# Patient Record
Sex: Male | Born: 1970 | Race: Black or African American | Hispanic: No | Marital: Married | Smoking: Never smoker
Health system: Southern US, Community
[De-identification: ages and names within clinical notes are randomized; demographics above are authoritative.]

## PROBLEM LIST (undated history)

## (undated) DIAGNOSIS — I10 Essential (primary) hypertension: Secondary | ICD-10-CM

---

## 1999-02-14 ENCOUNTER — Other Ambulatory Visit: Admission: RE | Admit: 1999-02-14 | Discharge: 1999-02-14 | Payer: Self-pay | Admitting: Urology

## 1999-07-26 ENCOUNTER — Emergency Department (HOSPITAL_COMMUNITY): Admission: EM | Admit: 1999-07-26 | Discharge: 1999-07-26 | Payer: Self-pay | Admitting: Emergency Medicine

## 2003-10-14 ENCOUNTER — Ambulatory Visit (HOSPITAL_COMMUNITY): Admission: RE | Admit: 2003-10-14 | Discharge: 2003-10-14 | Payer: Self-pay | Admitting: Family Medicine

## 2003-10-14 ENCOUNTER — Emergency Department (HOSPITAL_COMMUNITY): Admission: AD | Admit: 2003-10-14 | Discharge: 2003-10-14 | Payer: Self-pay | Admitting: Family Medicine

## 2003-10-17 ENCOUNTER — Inpatient Hospital Stay (HOSPITAL_COMMUNITY): Admission: EM | Admit: 2003-10-17 | Discharge: 2003-10-19 | Payer: Self-pay | Admitting: *Deleted

## 2003-10-18 ENCOUNTER — Encounter: Payer: Self-pay | Admitting: Cardiology

## 2007-02-11 ENCOUNTER — Emergency Department (HOSPITAL_COMMUNITY): Admission: EM | Admit: 2007-02-11 | Discharge: 2007-02-11 | Payer: Self-pay | Admitting: Emergency Medicine

## 2011-01-23 NOTE — H&P (Signed)
NAMEADVIT, TRETHEWEY NO.:  [id number]   MEDICAL RECORD NO.:  [id number]                   PATIENT TYPE:  INP   LOCATION:  2014                                 FACILITY:  [facility]   PHYSICIAN:  Vida Roller, M.D.                DATE OF BIRTH:  05-18-1971   DATE OF ADMISSION:  10/17/2003  DATE OF DISCHARGE:                                HISTORY & PHYSICAL   PRIMARY CARE PHYSICIAN:  Almedia Balls, M.D. in [place], [place].  He  has no cardiology ongoing care.   HISTORY OF PRESENT ILLNESS:  Mr. Nunn is a 40 year old African American man  who has had chest pain for about six days. He has been evaluated in the  Urgent Care section of [facility] on Friday after the  onset of the discomfort in his chest.  He was evaluated with a chest x-ray.  A CT scan of his chest looking for an aneurysm and electrocardiogram and  cardiac enzymes.  The electrocardiogram was borderline abnormal with some  inverted T-waves in the inferior leads.  He was sent home.  He states that  he was in church when it started kind of at rest.  It came on his mid  sternum under his left breast, radiated through to his back.  It was worse  with twisting motions, reproducible with palpation, primarily under the left  breast.  He has a mild amount of splinting in that area.  When he lies  still, he is quite comfortable and the pain generally resides, but when he  moves around, takes a deep breath, or does any other activity, the pain  worsens.  It does not worsen when he walks on a straight level surface or  does any other physical activity. He has no PND or orthopnea.  No lower  extremity edema.  He denies any recent upper respiratory infection. He has  not had any trauma to his lower extremities nor has he had any long car ride  or plane rides or any other stationary activity. He does work in a computer  area and does not do a lot of physical activity but that has  been a long-  term situation for him.   PAST MEDICAL HISTORY:  1. Significant for hypertension for which he takes Toprol XL; unfortunately,     he did not take his medication today.  2. 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  3. He has never had any other medical problems.   MEDICATIONS:  Toprol XL 50 mg 1 q.d.   SOCIAL HISTORY:  He lives in [place] with his wife, his 37 year old son,  and his 70-year-old daughter. He does not smoke and he never has.  He quit  drinking alcohol two years ago.  Does not use any drugs.  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   FAMILY HISTORY:  His mother is alive.  She has diabetes.  She had a bypass  surgery at relatively early age.  His father is also alive.  He had a  myocardial infarction a couple of years ago.  He has two brothers-one of  whom has hypertension.   REVIEW OF SYMPTOMS:  Relatively unremarkable, specifically all of his review  of systems is negative.   ALLERGIES:  He said that he had an anaphylactic reaction to an antibiotic  that started with V which sounds like it might have VELOSEF, I am going to  assume and anaphylaxis to CEPHALOSPORINS.   PHYSICAL EXAMINATION:  GENERAL:  He is a well-developed, well-nourished  moderately obese African American male in no apparent distress who is alert  and oriented x 4 and a very good historian.  He is afebrile.  VITAL SIGNS:  His pulse is 82 and regular in sinus.  Respiratory rate is 19.  His blood pressure is 174/113.  HEENT:  Unremarkable.  He does have a partial denture in his upper teeth.  NECK:  Supple.  He has no jugular venous distention or carotid bruits.  CARDIOVASCULAR:  He has a nondisplaced point of maximal impulse with no  lifts or thrills.  First and second heart sounds are normal.  There is a  fourth heart sound but there is no friction rub and there are no obvious  murmurs.  LUNGS:  Generally clear to auscultation,  although he is not really able to  take an entirely full breath because he does have some splinting but there  is no obvious friction rub either.  SKIN:  Without rash or lesions.  ABDOMEN:  Soft and nontender with normoactive bowel sounds.  There is no  hepatosplenomegaly noted.  GENITOURINARY:  Deferred.  EXTREMITIES:  He has no clubbing, cyanosis, or edema.  His pulses are all 2+  throughout without any bruits.  MUSCULOSKELETAL:  Within normal limits without any significant deformities.  NEUROLOGICAL:  Entirely nonfocal.   LABORATORY DATA:  His chest x-ray is pending.  His electrocardiogram shows  sinus rhythm at a rate of 71 with just mild axis deviation.  He has T-wave  inversions in the inferior leads and the anterolateral leads.  The  anterolateral T-waves are new from previous.  The inferior T-waves were  present on the electrocardiogram done on the 6th when he was evaluated.   White blood cell count of 3.3.  H&H of 16 and 48 with a platelet count of  329,000.  His d-dimer is 0.47 which is the absolute upper limits of normal.  His PTT is 28.  His PT is 12.3 with an INR of 0.9.  His electrolytes are all  pending at this time.  His one set of bedside cardiac enzymes which are  entirely normal show a myoglobin of 27.6, MB fraction of less than 1.0, and  a troponin I of less than 0.05.   ASSESSMENT:  This is a man who has chest discomfort which is very atypical  for coronary disease.  It is pleuritic and primarily positional,  reproducible with palpation.  He does have an abnormal electrocardiogram  which lends me to think that there is a possibility that this may be a  pericarditis, although he does not have an antecedent viral infection.  Other possibilities include the potential for pulmonary embolus.  He does  have uncontrolled hypertension, probably due to beta blocker rebound from his noncompliance with  his beta blocker and the d-dimer is borderline  abnormal.   PLAN:  My  plan is to do a chest CT to rule out pulmonary embolus.  Also  think an echocardiogram to make sure he does not have a significant  pericardial effusion.  The CT scan that was previously done on the 6th of  January shows that his heart might be slightly enlarged.  There was no  comment about a pericardial effusion, however.  I will cycle his cardiac  enzymes.  At this point, I am not going to aggressively treat him with an  antianginal regimen or anticoagulate him due to my thoughts regarding this  as I am uncertain as to whether or not he has pericarditis and  anticoagulation for pericarditis may precipitate a hemorrhagic pericarditis.  If a CT scan of his chest does show that he has a pulmonary embolus, then I  will anticoagulate him and obviously if he has abnormal enzymes manifest  abnormal cardiac enzymes will anticoagulate and will aggressively treat him  for that.  I think in the interim, at this time, it might not be  unreasonable to use nonsteroidal anti-inflammatory drugs for pain.  I am  going to add back his Toprol XL.  I am also going to add some Norvasc to try  to get a little bit of blood pressure control and will follow him along.                                                Vida Roller, M.D.    JH/MEDQ  D:  10/17/2003  T:  10/17/2003  Job:  [id number]

## 2011-01-23 NOTE — Discharge Summary (Signed)
Travis Malone, Travis Malone NO.:  [id number]   MEDICAL RECORD NO.:  [id number]                   PATIENT TYPE:  INP   LOCATION:  2014                                 FACILITY:  [facility]   PHYSICIAN:  Tereso Newcomer, P.A.                  DATE OF BIRTH:  [birth year]   DATE OF ADMISSION:  10/17/2003  DATE OF DISCHARGE:                                 DISCHARGE SUMMARY   DISCHARGE DIAGNOSES:  1. Pleuritic chest pain.  2. Hypertension.  3. Family history of coronary artery disease.   PROCEDURES PERFORMED:  1. Chest CT scan to rule out pulmonary embolism.  2. 2D echocardiogram.   HOSPITAL COURSE:  Please see the dictated admission history and physical by  Dr. Vida Roller for complete details. Briefly this 40 year old African  American male presented to [facility] for further evaluation of  chest discomfort. He had recently been evaluated at the urgent care section  of [facility] with the same symptoms. Apparently a CT scan was done looking  for aneurysm. The patient's chest discomfort was generally worse with  position changes. He also has worsening chest discomfort  when he takes a  deep breath. His EKG was abnormal, revealing inferolateral T-wave  inversions.   HOSPITAL COURSE:  He was admitted. Serial enzymes were checked. These were  negative for myocardial infarction. A D-dimer was performed and this was  normal at 0.47. A repeat chest CT scan was done on October 17, 2003. This  revealed minor patchy lower lobe atelectatic and infiltrative changes. There  was no CT scan evidence of pulmonary emboli. The lower extremity scan was  made for DVT.   The patient was also set up for a 2D echocardiogram. The 2D echocardiogram  revealed normal LVF with the range being 55% to 65%. The study was not  adequate for the evaluation of the left ventricular wall motion  abnormalities. There was mildly increased  left ventricular wall thickness.   The patient  was placed on nonsteroidal antiinflammatory drugs. His blood  pressure was somewhat uncontrolled on admission and Norvasc  and  chlorthalidone were both added to his Toprol.   On the morning of October 19, 2003, he was stable with much improvement in  his  chest diseased. Dr. Gerri Spore saw  the patient and felt he could be  discharged to home. Dr. Gerri Spore felt the patient should be further  evaluated for an outpatient Cardiolite, and this will be performed in our  office next week. If the Cardiolite is normal. Then he will require no  further cardiology follow up. He should follow up with his primary care  physician though in the next  1 to  2 weeks. He should complete about 5 days  of nonsteroidal antiinflammatory drugs therapy. A BMET will be checked at  the time of his Cardiolite to ensure that his potassium level remains  normal  after starting chlorthalidone.   LABORATORY DATA:  White count 3300, hemoglobin 16.3, hematocrit 47.9,  platelet count 349,000. Sedimentation rate 4. INR 0.9. Sodium 138, potassium  4.2, chloride 107, CO2 24, glucose 83, BUN 7, creatinine 1.1, calcium  9.2.  Total protein 7.6, albumin 3.8. AST 26, ALT 20, alkaline phosphatase 72,  total bilirubin 0.5. Cardiac enzymes as noted above. TSH normal at 1.13.   Chest x-ray on admission, cardiomegaly  with mildly accentuated  bronchovascular markings. A CT scan and 2D echocardiogram as noted above.   DISCHARGE MEDICATIONS:  1. Toprol XL 50 mg daily.  2. Norvasc 5 mg daily.  3. Chlorthalidone 12.5 mg daily.  4. Naprosyn 375 mg b.i.d. x5 days.  5. Tylenol  p.r.n. pain management.   DISCHARGE INSTRUCTIONS:  Activity slowly advance as tolerated. Diet low fat,  low sodium. The patient has been asked to eat potassium containing foods  such as bananas 2 to 3 times per week to help with potassium  repletion with  the initiation of chlorthalidone.   FOLLOW UP:  The patient will be set up for a stress Cardiolite in  our office  on October 24, 2003, at 12 p.m. He has been asked to remain n.p.o. after  midnight on October 23, 2003. He has been asked to refrain from  drinking  coffee or any caffeine on the day of the test. He is to also hold his Toprol  on the morning of his test. He should follow up with Dr. Almedia Balls in the  next 1 to 2 weeks. Of note a BMET will be drawn at the time of his stress  Cardiolite. If his Cardiolite is not ischemic then he will require no  further cardiac follow up.                                                Tereso Newcomer, P.A.    SW/MEDQ  D:  10/19/2003  T:  10/19/2003  Job:  [id number]   cc:   Almedia Balls  [address]  [place]  [place] [address]  Fax: [phone]

## 2011-06-25 LAB — DIFFERENTIAL
Basophils Relative: 1
Monocytes Absolute: 0.8 — ABNORMAL HIGH
Monocytes Relative: 14 — ABNORMAL HIGH
Neutro Abs: 3.5

## 2011-06-25 LAB — CBC
Hemoglobin: 15.1
MCHC: 33.5
MCV: 87.8
RBC: 5.12

## 2011-06-25 LAB — I-STAT 8, (EC8 V) (CONVERTED LAB)
BUN: 7
Bicarbonate: 25.4 — ABNORMAL HIGH
Glucose, Bld: 97
Hemoglobin: 16
Operator id: 279831
pCO2, Ven: 44.4 — ABNORMAL LOW
pH, Ven: 7.366 — ABNORMAL HIGH

## 2011-06-25 LAB — POCT CARDIAC MARKERS
CKMB, poc: <1 — ABNORMAL LOW
CKMB, poc: <1 — ABNORMAL LOW
Troponin i, poc: <0.05
Troponin i, poc: <0.05

## 2011-06-25 LAB — APTT: aPTT: 28

## 2016-07-07 ENCOUNTER — Other Ambulatory Visit (HOSPITAL_COMMUNITY): Payer: Self-pay | Admitting: Surgery

## 2016-07-21 ENCOUNTER — Ambulatory Visit (HOSPITAL_COMMUNITY): Payer: Self-pay

## 2017-04-14 ENCOUNTER — Encounter (HOSPITAL_BASED_OUTPATIENT_CLINIC_OR_DEPARTMENT_OTHER): Payer: Self-pay | Admitting: Emergency Medicine

## 2017-04-14 ENCOUNTER — Emergency Department (HOSPITAL_BASED_OUTPATIENT_CLINIC_OR_DEPARTMENT_OTHER)
Admission: EM | Admit: 2017-04-14 | Discharge: 2017-04-14 | Disposition: A | Discharge status: Home / Self Care | Payer: BLUE CROSS/BLUE SHIELD | Attending: Emergency Medicine | Admitting: Emergency Medicine

## 2017-04-14 DIAGNOSIS — I10 Essential (primary) hypertension: Secondary | ICD-10-CM | POA: Insufficient documentation

## 2017-04-14 DIAGNOSIS — Z0000 Encounter for general adult medical examination without abnormal findings: Secondary | ICD-10-CM | POA: Diagnosis not present

## 2017-04-14 DIAGNOSIS — W57XXXA Bitten or stung by nonvenomous insect and other nonvenomous arthropods, initial encounter: Secondary | ICD-10-CM | POA: Diagnosis not present

## 2017-04-14 DIAGNOSIS — Z79899 Other long term (current) drug therapy: Secondary | ICD-10-CM | POA: Insufficient documentation

## 2017-04-14 HISTORY — DX: Essential (primary) hypertension: I10

## 2017-04-14 MED ORDER — DOXYCYCLINE HYCLATE 100 MG PO TABS
200.0000 mg | ORAL_TABLET | Freq: Once | ORAL | Status: AC
  Administered 2017-04-14: 200 mg via ORAL
  Filled 2017-04-14: qty 2

## 2017-04-14 NOTE — ED Provider Notes (Signed)
[facility] Provider Note   CSN: [id number] Arrival date & time: 04/14/17  2228  By signing my name below, I, [name], attest that this documentation has been prepared under the direction and in the presence of Chayla Shands, MD. Electronically Signed: [name], ED Scribe. 04/14/17. 11:01 PM.  History   Chief Complaint Chief Complaint  Patient presents with  . Tick Removal   The history is provided by the patient. No language interpreter was used.  Illness  This is a new problem. The current episode started 12 to 24 hours ago. The problem occurs constantly. The problem has not changed since onset.Pertinent negatives include no chest pain and no abdominal pain. Nothing aggravates the symptoms. Nothing relieves the symptoms. He has tried nothing for the symptoms. The treatment provided no relief.    HPI Comments: Travis Malone Car is a 46 y.o. male who presents to the Emergency Department complaining of a resolved tick bite that he noticed on his mid-back yesterday. He denies fever.    Past Medical History:  Diagnosis Date  . Hypertension     There are no active problems to display for this patient.   History reviewed. No pertinent surgical history.     Home Medications    Prior to Admission medications   Medication Sig Start Date End Date Taking? Authorizing Provider  amLODipine (NORVASC) 5 MG tablet Take 5 mg by mouth daily.   Yes [provider]  metoprolol succinate (TOPROL-XL) 50 MG 24 hr tablet Take 50 mg by mouth daily. Take with or immediately following a meal.   Yes [provider]    Family History No family history on file.  Social History Social History  Substance Use Topics  . Smoking status: Never Smoker  . Smokeless tobacco: Never Used  . Alcohol use No     Allergies   Patient has no known allergies.   Review of Systems Review of Systems  Constitutional: Negative for fever.  Cardiovascular:  Negative for chest pain.  Gastrointestinal: Negative for abdominal pain.  Skin: Negative for rash.  All other systems reviewed and are negative.    Physical Exam Updated Vital Signs BP (!) 155/107   Pulse 77   Temp 98.4 F (36.9 C) (Oral)   Resp 18   Ht 6' (1.829 m)   Wt 272 lb (123.4 kg)   SpO2 100%   BMI 36.89 kg/m   Physical Exam  Constitutional: He is oriented to person, place, and time. He appears well-developed and well-nourished.  HENT:  Head: Normocephalic and atraumatic.  Eyes: Pupils are equal, round, and reactive to light. EOM are normal.  Neck: Normal range of motion.  Cardiovascular: Normal rate, regular rhythm, normal heart sounds and intact distal pulses.   Pulmonary/Chest: Effort normal and breath sounds normal. No respiratory distress.  Abdominal: Soft. He exhibits no distension. There is no tenderness.  Musculoskeletal: Normal range of motion.  Neurological: He is alert and oriented to person, place, and time. He displays normal reflexes.  Skin: Skin is warm and dry. No rash noted.  No target lesions  Psychiatric: He has a normal mood and affect. Judgment normal.  Nursing note and vitals reviewed.    ED Treatments / Results   DIAGNOSTIC STUDIES: Oxygen Saturation is 100% on RA, normal by my interpretation.   COORDINATION OF CARE: 11:03 PM-Discussed next steps with pt. Pt verbalized understanding and is agreeable with the plan.    Procedures Procedures (including critical care time)  Medications Ordered in ED  Medications  doxycycline (VIBRA-TABS) tablet 200 mg (200 mg Oral Given 04/14/17 2307)     Tick was on the skin and not buried.    200 of doxycyline as the tick was not embedded, per CDC guidelines.   Final Clinical Impressions(s) / ED Diagnoses   She is very well appearing and has been observed in the ED.  Strict return precautions given for facial swelling, fevers, drooling, swelling of the mouth or throat, vomiting, weakness,  inability to tolerate oral liquids or foods, shortness of breath, changes in vision or thinking, weakness or numbness or any concerns. No signs of systemic illness or infection. The patient is nontoxic-appearing on exam and vital signs are within normal limits.     I have reviewed the triage vital signs and the nursing notes. Pertinent labs &imaging results that were available during my care of the patient were reviewed by me and considered in my medical decision making (see chart for details).  After history, exam, and medical workup I feel the patient has been appropriately medically screened and is safe for discharge home. Pertinent diagnoses were discussed with the patient. Patient was given return precautions.  I personally performed the services described in this documentation, which was scribed in my presence. The recorded information has been reviewed and is accurate.      Cylus Douville, MD 04/15/17 [phone]

## 2017-04-14 NOTE — ED Notes (Signed)
ED Provider at bedside. 

## 2017-04-14 NOTE — ED Notes (Signed)
Pt has tick on back. Rubbed tick with alcohol pad and tick came off whole.

## 2017-04-15 ENCOUNTER — Encounter (HOSPITAL_BASED_OUTPATIENT_CLINIC_OR_DEPARTMENT_OTHER): Payer: Self-pay | Admitting: Emergency Medicine

## 2019-09-11 ENCOUNTER — Encounter (HOSPITAL_BASED_OUTPATIENT_CLINIC_OR_DEPARTMENT_OTHER): Payer: Self-pay | Admitting: Emergency Medicine

## 2019-09-11 ENCOUNTER — Inpatient Hospital Stay (HOSPITAL_BASED_OUTPATIENT_CLINIC_OR_DEPARTMENT_OTHER)
Admission: EM | Admit: 2019-09-11 | Discharge: 2019-09-13 | DRG: 177 | Disposition: A | Discharge status: Home / Self Care | Payer: BC Managed Care – PPO | Attending: Internal Medicine | Admitting: Internal Medicine

## 2019-09-11 ENCOUNTER — Other Ambulatory Visit: Payer: Self-pay

## 2019-09-11 ENCOUNTER — Emergency Department (HOSPITAL_BASED_OUTPATIENT_CLINIC_OR_DEPARTMENT_OTHER): Payer: BC Managed Care – PPO

## 2019-09-11 DIAGNOSIS — Z6836 Body mass index (BMI) 36.0-36.9, adult: Secondary | ICD-10-CM

## 2019-09-11 DIAGNOSIS — J1282 Pneumonia due to coronavirus disease 2019: Secondary | ICD-10-CM | POA: Diagnosis present

## 2019-09-11 DIAGNOSIS — J9601 Acute respiratory failure with hypoxia: Secondary | ICD-10-CM | POA: Diagnosis present

## 2019-09-11 DIAGNOSIS — R110 Nausea: Secondary | ICD-10-CM | POA: Diagnosis present

## 2019-09-11 DIAGNOSIS — E669 Obesity, unspecified: Secondary | ICD-10-CM | POA: Diagnosis present

## 2019-09-11 DIAGNOSIS — J069 Acute upper respiratory infection, unspecified: Secondary | ICD-10-CM | POA: Diagnosis not present

## 2019-09-11 DIAGNOSIS — R059 Cough, unspecified: Secondary | ICD-10-CM

## 2019-09-11 DIAGNOSIS — R05 Cough: Secondary | ICD-10-CM | POA: Diagnosis present

## 2019-09-11 DIAGNOSIS — Z79899 Other long term (current) drug therapy: Secondary | ICD-10-CM | POA: Diagnosis not present

## 2019-09-11 DIAGNOSIS — R0902 Hypoxemia: Secondary | ICD-10-CM

## 2019-09-11 DIAGNOSIS — I10 Essential (primary) hypertension: Secondary | ICD-10-CM | POA: Diagnosis present

## 2019-09-11 DIAGNOSIS — R7989 Other specified abnormal findings of blood chemistry: Secondary | ICD-10-CM | POA: Diagnosis not present

## 2019-09-11 DIAGNOSIS — J189 Pneumonia, unspecified organism: Secondary | ICD-10-CM | POA: Diagnosis present

## 2019-09-11 DIAGNOSIS — U071 COVID-19: Secondary | ICD-10-CM | POA: Diagnosis present

## 2019-09-11 LAB — CBC WITH DIFFERENTIAL/PLATELET
Abs Immature Granulocytes: 0.02 10*3/uL (ref 0.00–0.07)
Basophils Absolute: 0 10*3/uL (ref 0.0–0.1)
Basophils Relative: 1 %
Eosinophils Absolute: 0.1 10*3/uL (ref 0.0–0.5)
Eosinophils Relative: 1 %
HCT: 41.3 % (ref 39.0–52.0)
Hemoglobin: 14.7 g/dL (ref 13.0–17.0)
Immature Granulocytes: 0 %
Lymphocytes Relative: 25 %
Lymphs Abs: 1.4 10*3/uL (ref 0.7–4.0)
MCH: 30.8 pg (ref 26.0–34.0)
MCHC: 35.6 g/dL (ref 30.0–36.0)
MCV: 86.4 fL (ref 80.0–100.0)
Monocytes Absolute: 0.7 10*3/uL (ref 0.1–1.0)
Monocytes Relative: 12 %
Neutro Abs: 3.4 10*3/uL (ref 1.7–7.7)
Neutrophils Relative %: 61 %
Platelets: 275 10*3/uL (ref 150–400)
RBC: 4.78 MIL/uL (ref 4.22–5.81)
RDW: 13.4 % (ref 11.5–15.5)
WBC: 5.5 10*3/uL (ref 4.0–10.5)
nRBC: 0 % (ref 0.0–0.2)

## 2019-09-11 LAB — LACTIC ACID, PLASMA: Lactic Acid, Venous: 0.7 mmol/L (ref 0.5–1.9)

## 2019-09-11 LAB — COMPREHENSIVE METABOLIC PANEL
ALT: 26 U/L (ref 0–44)
AST: 34 U/L (ref 15–41)
Albumin: 3.3 g/dL — ABNORMAL LOW (ref 3.5–5.0)
Alkaline Phosphatase: 48 U/L (ref 38–126)
Anion gap: 10 (ref 5–15)
BUN: 12 mg/dL (ref 6–20)
CO2: 26 mmol/L (ref 22–32)
Calcium: 8.4 mg/dL — ABNORMAL LOW (ref 8.9–10.3)
Chloride: 100 mmol/L (ref 98–111)
Creatinine, Ser: 1.26 mg/dL — ABNORMAL HIGH (ref 0.61–1.24)
GFR calc Af Amer: >60 mL/min (ref >60)
GFR calc non Af Amer: >60 mL/min (ref >60)
Glucose, Bld: 91 mg/dL (ref 70–99)
Potassium: 3.7 mmol/L (ref 3.5–5.1)
Sodium: 136 mmol/L (ref 135–145)
Total Bilirubin: 0.8 mg/dL (ref 0.3–1.2)
Total Protein: 7.5 g/dL (ref 6.5–8.1)

## 2019-09-11 LAB — FERRITIN: Ferritin: 233 ng/mL (ref 24–336)

## 2019-09-11 LAB — LACTATE DEHYDROGENASE: LDH: 380 U/L — ABNORMAL HIGH (ref 98–192)

## 2019-09-11 LAB — D-DIMER, QUANTITATIVE: D-Dimer, Quant: 5.05 ug/mL-FEU — ABNORMAL HIGH (ref 0.00–0.50)

## 2019-09-11 LAB — PROCALCITONIN: Procalcitonin: 0.12 ng/mL

## 2019-09-11 LAB — TRIGLYCERIDES: Triglycerides: 150 mg/dL — ABNORMAL HIGH (ref <150)

## 2019-09-11 LAB — FIBRINOGEN: Fibrinogen: 643 mg/dL — ABNORMAL HIGH (ref 210–475)

## 2019-09-11 LAB — C-REACTIVE PROTEIN: CRP: 14.2 mg/dL — ABNORMAL HIGH (ref <1.0)

## 2019-09-11 MED ORDER — ALBUTEROL SULFATE (2.5 MG/3ML) 0.083% IN NEBU: 5.0000 mg | INHALATION_SOLUTION | Freq: Once | RESPIRATORY_TRACT | Status: DC

## 2019-09-11 MED ORDER — SODIUM CHLORIDE 0.9 % IV SOLN
100.0000 mg | Freq: Every day | INTRAVENOUS | Status: DC
  Administered 2019-09-12 – 2019-09-13 (×2): 100 mg via INTRAVENOUS
  Filled 2019-09-11 (×3): qty 20

## 2019-09-11 MED ORDER — SODIUM CHLORIDE 0.9 % IV SOLN
100.0000 mg | INTRAVENOUS | Status: AC
  Administered 2019-09-11 (×2): 100 mg via INTRAVENOUS
  Filled 2019-09-11 (×3): qty 20

## 2019-09-11 MED ORDER — ALBUTEROL SULFATE HFA 108 (90 BASE) MCG/ACT IN AERS
INHALATION_SPRAY | RESPIRATORY_TRACT | Status: AC
  Administered 2019-09-11: 8
  Filled 2019-09-11: qty 6.7

## 2019-09-11 MED ORDER — DEXAMETHASONE SODIUM PHOSPHATE 10 MG/ML IJ SOLN
6.0000 mg | Freq: Once | INTRAMUSCULAR | Status: AC
  Administered 2019-09-11: 6 mg via INTRAVENOUS
  Filled 2019-09-11: qty 1

## 2019-09-11 NOTE — ED Triage Notes (Signed)
Pt states that he has had COVID x 1.5 weeks - the patient has started to have Cough Congestion and more generalized aches - the pt also reports that he has a fever up to 102.0

## 2019-09-11 NOTE — ED Provider Notes (Signed)
[facility] EMERGENCY DEPARTMENT Provider Note   CSN: [id number] Arrival date & time: 09/11/19  7414     History Chief Complaint  Patient presents with  . Generalized Body Aches    COVID+    Travis Malone is a 49 y.o. male.  HPI   Patient presents to the emergency room with complaints of shortness of breath and fever.  Patient states he was diagnosed with Covid a couple days before Christmas.  His symptoms have persisted and have progressed.  He is having myalgias.  He continues to have fevers.  He has been coughing and feels a rattling in his chest.  He is feeling lightheaded and short of breath now especially with any minimal activity.  Patient was seen in urgent care on the 29th because of his persistent symptoms.  He was prescribed cough medications.  He has continued to progress despite those treatments.  Past Medical History:  Diagnosis Date  . Hypertension     There are no problems to display for this patient.   History reviewed. No pertinent surgical history.     History reviewed. No pertinent family history.  Social History   Tobacco Use  . Smoking status: Never Smoker  . Smokeless tobacco: Never Used  Substance Use Topics  . Alcohol use: No  . Drug use: Not on file    Home Medications Prior to Admission medications   Medication Sig Start Date End Date Taking? Authorizing Provider  amLODipine (NORVASC) 5 MG tablet Take 5 mg by mouth daily.    [provider]  metoprolol succinate (TOPROL-XL) 50 MG 24 hr tablet Take 50 mg by mouth daily. Take with or immediately following a meal.    [provider]    Allergies    Patient has no known allergies.  Review of Systems   Review of Systems  All other systems reviewed and are negative.   Physical Exam Updated Vital Signs BP (!) 130/92 (BP Location: Left Arm)   Pulse 74   Temp 98.3 F (36.8 C) (Oral)   Resp 16   Ht 1.829 m (6')   Wt 122.9 kg   SpO2 93%   BMI 36.75  kg/m   Physical Exam Vitals and nursing note reviewed.  Constitutional:      Appearance: He is well-developed. He is ill-appearing. He is not toxic-appearing or diaphoretic.  HENT:     Head: Normocephalic and atraumatic.     Right Ear: External ear normal.     Left Ear: External ear normal.  Eyes:     General: No scleral icterus.       Right eye: No discharge.        Left eye: No discharge.     Conjunctiva/sclera: Conjunctivae normal.  Neck:     Trachea: No tracheal deviation.  Cardiovascular:     Rate and Rhythm: Normal rate and regular rhythm.  Pulmonary:     Effort: Pulmonary effort is normal. No respiratory distress.     Breath sounds: Normal breath sounds. No stridor. No wheezing or rales.     Comments: Oxygen saturation noted to be 88 at the bedside before being placed on oxygen Abdominal:     General: Bowel sounds are normal. There is no distension.     Palpations: Abdomen is soft.     Tenderness: There is no abdominal tenderness. There is no guarding or rebound.  Musculoskeletal:        General: No tenderness.  Cervical back: Neck supple.  Skin:    General: Skin is warm and dry.     Findings: No rash.  Neurological:     Mental Status: He is alert.     Cranial Nerves: No cranial nerve deficit (no facial droop, extraocular movements intact, no slurred speech).     Sensory: No sensory deficit.     Motor: No abnormal muscle tone or seizure activity.     Coordination: Coordination normal.     ED Results / Procedures / Treatments   Labs (all labs ordered are listed, but only abnormal results are displayed) Labs Reviewed  COMPREHENSIVE METABOLIC PANEL - Abnormal; Notable for the following components:      Result Value   Creatinine, Ser 1.26 (*)    Calcium 8.4 (*)    Albumin 3.3 (*)    All other components within normal limits  D-DIMER, QUANTITATIVE (NOT AT [facility]) - Abnormal; Notable for the following components:   D-Dimer, Quant 5.05 (*)    All other  components within normal limits  CULTURE, BLOOD (ROUTINE X 2)  CULTURE, BLOOD (ROUTINE X 2)  LACTIC ACID, PLASMA  CBC WITH DIFFERENTIAL/PLATELET  LACTIC ACID, PLASMA  PROCALCITONIN  LACTATE DEHYDROGENASE  FERRITIN  TRIGLYCERIDES  FIBRINOGEN  C-REACTIVE PROTEIN    EKG EKG Interpretation  Date/Time:  Monday September 11 2019 12:22:15 EST Ventricular Rate:  71 PR Interval:    QRS Duration: 89 QT Interval:  401 QTC Calculation: 436 R Axis:   57 Text Interpretation: Sinus rhythm Abnormal T, consider ischemia, diffuse leads No significant change since last tracing Confirmed by Dorie Rank [phone]) on 09/11/2019 12:47:23 PM   Radiology DG Chest Port 1 View  Result Date: 09/11/2019 CLINICAL DATA:  Cough and congestion.  Reported COVID-19 positive EXAM: PORTABLE CHEST 1 VIEW COMPARISON:  February 11, 2007. FINDINGS: There is patchy airspace opacity in each mid and lower lung zone. Heart is borderline enlarged with pulmonary vascularity normal. No adenopathy. No bone lesions. IMPRESSION: Patchy airspace opacity bilaterally consistent with multifocal pneumonia. Borderline cardiac enlargement. No evident adenopathy. Electronically Signed   By: Lowella Grip III M.D.   On: 09/11/2019 11:04    Procedures Procedures (including critical care time)  Medications Ordered in ED Medications  albuterol (PROVENTIL) (2.5 MG/3ML) 0.083% nebulizer solution 5 mg (5 mg Nebulization Not Given 09/11/19 1226)  dexamethasone (DECADRON) injection 6 mg (has no administration in time range)  albuterol (VENTOLIN HFA) 108 (90 Base) MCG/ACT inhaler (8 puffs  Given 09/11/19 1231)    ED Course  I have reviewed the triage vital signs and the nursing notes.  Pertinent labs & imaging results that were available during my care of the patient were reviewed by me and considered in my medical decision making (see chart for details).  Clinical Course as of Sep 10 1253  Mon Sep 11, 2019  1253 Chest x-ray shows multifocal  pneumonia.  Laboratory tests are notable for elevated D-dimer consistent with his Covid 19 virus infection.   [JK]    Clinical Course User Index [JK] Dorie Rank, MD   MDM Rules/Calculators/A&P                      Travis Malone was evaluated in Emergency Department on 09/11/2019 for the symptoms described in the history of present illness. He was evaluated in the context of the global COVID-19 pandemic, which necessitated consideration that the patient might be at risk for infection with the SARS-CoV-2 virus that causes  COVID-19. Institutional protocols and algorithms that pertain to the evaluation of patients at risk for COVID-19 are in a state of rapid change based on information released by regulatory bodies including the CDC and federal and state organizations. These policies and algorithms were followed during the patient's care in the ED.  Patient presented to the emergency room for worsening symptoms associated with known COVID-19 diagnosis.  Patient does have pulmonary infiltrates.  He has an oxygen requirement.  While in the ED, the patient's oxygen saturation dropped into the 80s.  This occurred as he walked from the waiting room to the bedside.  He has remained stable on supplemental nasal cannula oxygen.  I will consult with medical service for admission and further treatment.  Final Clinical Impression(s) / ED Diagnoses Final diagnoses:  Pneumonia due to COVID-19 virus  Hypoxia     Linwood Dibbles, MD 09/11/19 1256

## 2019-09-11 NOTE — ED Notes (Signed)
C/o fever, congestion, cough  Chills, body aches  Loss of taste andm smell,   Is covid pos

## 2019-09-11 NOTE — ED Notes (Signed)
Patient notified his spouse of plan for transfer to [facility]; room number give to patient to give to spouse.

## 2019-09-11 NOTE — ED Notes (Signed)
Pt placed on o2 @ 2l [place].

## 2019-09-12 DIAGNOSIS — J1282 Pneumonia due to coronavirus disease 2019: Secondary | ICD-10-CM

## 2019-09-12 DIAGNOSIS — U071 COVID-19: Principal | ICD-10-CM

## 2019-09-12 LAB — CBC
HCT: 38.6 % — ABNORMAL LOW (ref 39.0–52.0)
Hemoglobin: 14.3 g/dL (ref 13.0–17.0)
MCH: 32.1 pg (ref 26.0–34.0)
MCHC: 37 g/dL — ABNORMAL HIGH (ref 30.0–36.0)
MCV: 86.7 fL (ref 80.0–100.0)
Platelets: 346 10*3/uL (ref 150–400)
RBC: 4.45 MIL/uL (ref 4.22–5.81)
RDW: 13.5 % (ref 11.5–15.5)
WBC: 5.9 10*3/uL (ref 4.0–10.5)
nRBC: 0 % (ref 0.0–0.2)

## 2019-09-12 LAB — COMPREHENSIVE METABOLIC PANEL WITH GFR
ALT: 39 U/L (ref 0–44)
AST: 49 U/L — ABNORMAL HIGH (ref 15–41)
Albumin: 3.3 g/dL — ABNORMAL LOW (ref 3.5–5.0)
Alkaline Phosphatase: 48 U/L (ref 38–126)
Anion gap: 10 (ref 5–15)
BUN: 14 mg/dL (ref 6–20)
CO2: 25 mmol/L (ref 22–32)
Calcium: 8.8 mg/dL — ABNORMAL LOW (ref 8.9–10.3)
Chloride: 106 mmol/L (ref 98–111)
Creatinine, Ser: 1.11 mg/dL (ref 0.61–1.24)
GFR calc Af Amer: >60 mL/min
GFR calc non Af Amer: >60 mL/min
Glucose, Bld: 99 mg/dL (ref 70–99)
Potassium: 4.1 mmol/L (ref 3.5–5.1)
Sodium: 141 mmol/L (ref 135–145)
Total Bilirubin: 0.8 mg/dL (ref 0.3–1.2)
Total Protein: 7.7 g/dL (ref 6.5–8.1)

## 2019-09-12 LAB — CREATININE, SERUM
Creatinine, Ser: 1.09 mg/dL (ref 0.61–1.24)
GFR calc Af Amer: >60 mL/min
GFR calc non Af Amer: >60 mL/min

## 2019-09-12 LAB — ABO/RH: ABO/RH(D): B POS

## 2019-09-12 LAB — C-REACTIVE PROTEIN: CRP: 12.2 mg/dL — ABNORMAL HIGH (ref <1.0)

## 2019-09-12 LAB — D-DIMER, QUANTITATIVE: D-Dimer, Quant: 5.47 ug/mL-FEU — ABNORMAL HIGH (ref 0.00–0.50)

## 2019-09-12 LAB — LACTIC ACID, PLASMA: Lactic Acid, Venous: 1 mmol/L (ref 0.5–1.9)

## 2019-09-12 MED ORDER — METOPROLOL SUCCINATE ER 25 MG PO TB24
50.0000 mg | ORAL_TABLET | Freq: Every day | ORAL | Status: DC
  Administered 2019-09-12 – 2019-09-13 (×2): 50 mg via ORAL
  Filled 2019-09-12 (×2): qty 2

## 2019-09-12 MED ORDER — ONDANSETRON HCL 4 MG/2ML IJ SOLN: 4.0000 mg | Freq: Four times a day (QID) | INTRAMUSCULAR | Status: DC | PRN

## 2019-09-12 MED ORDER — DEXAMETHASONE SODIUM PHOSPHATE 10 MG/ML IJ SOLN
6.0000 mg | INTRAMUSCULAR | Status: DC
  Administered 2019-09-12 – 2019-09-13 (×2): 6 mg via INTRAVENOUS
  Filled 2019-09-12 (×2): qty 1

## 2019-09-12 MED ORDER — SODIUM CHLORIDE 0.9 % IV SOLN: 100.0000 mg | Freq: Every day | INTRAVENOUS | Status: DC

## 2019-09-12 MED ORDER — ONDANSETRON HCL 4 MG PO TABS: 4.0000 mg | ORAL_TABLET | Freq: Four times a day (QID) | ORAL | Status: DC | PRN

## 2019-09-12 MED ORDER — ASCORBIC ACID 500 MG PO TABS
500.0000 mg | ORAL_TABLET | Freq: Every day | ORAL | Status: DC
  Administered 2019-09-12 – 2019-09-13 (×2): 500 mg via ORAL
  Filled 2019-09-12 (×2): qty 1

## 2019-09-12 MED ORDER — SODIUM CHLORIDE 0.9 % IV SOLN: 250.0000 mL | INTRAVENOUS | Status: DC | PRN

## 2019-09-12 MED ORDER — ACETAMINOPHEN 325 MG PO TABS: 650.0000 mg | ORAL_TABLET | Freq: Four times a day (QID) | ORAL | Status: DC | PRN

## 2019-09-12 MED ORDER — ENOXAPARIN SODIUM 60 MG/0.6ML ~~LOC~~ SOLN: 60.0000 mg | SUBCUTANEOUS | Status: DC

## 2019-09-12 MED ORDER — ZINC SULFATE 220 (50 ZN) MG PO CAPS
220.0000 mg | ORAL_CAPSULE | Freq: Every day | ORAL | Status: DC
  Administered 2019-09-12 – 2019-09-13 (×2): 220 mg via ORAL
  Filled 2019-09-12 (×2): qty 1

## 2019-09-12 MED ORDER — ENOXAPARIN SODIUM 40 MG/0.4ML ~~LOC~~ SOLN: 40.0000 mg | SUBCUTANEOUS | Status: DC

## 2019-09-12 MED ORDER — SODIUM CHLORIDE 0.9% FLUSH: 3.0000 mL | INTRAVENOUS | Status: DC | PRN

## 2019-09-12 MED ORDER — AMLODIPINE BESYLATE 5 MG PO TABS
5.0000 mg | ORAL_TABLET | Freq: Every day | ORAL | Status: DC
  Administered 2019-09-12 – 2019-09-13 (×2): 5 mg via ORAL
  Filled 2019-09-12 (×2): qty 1

## 2019-09-12 MED ORDER — SODIUM CHLORIDE 0.9% FLUSH
3.0000 mL | Freq: Two times a day (BID) | INTRAVENOUS | Status: DC
  Administered 2019-09-12 – 2019-09-13 (×4): 3 mL via INTRAVENOUS

## 2019-09-12 MED ORDER — SODIUM CHLORIDE 0.9 % IV SOLN: 200.0000 mg | Freq: Once | INTRAVENOUS | Status: DC

## 2019-09-12 MED ORDER — GUAIFENESIN-DM 100-10 MG/5ML PO SYRP: 10.0000 mL | ORAL_SOLUTION | ORAL | Status: DC | PRN

## 2019-09-12 MED ORDER — ENOXAPARIN SODIUM 60 MG/0.6ML ~~LOC~~ SOLN
60.0000 mg | Freq: Two times a day (BID) | SUBCUTANEOUS | Status: DC
  Administered 2019-09-12 – 2019-09-13 (×3): 60 mg via SUBCUTANEOUS
  Filled 2019-09-12 (×3): qty 0.6

## 2019-09-12 MED ORDER — HYDROCOD POLST-CPM POLST ER 10-8 MG/5ML PO SUER: 5.0000 mL | Freq: Two times a day (BID) | ORAL | Status: DC | PRN

## 2019-09-12 NOTE — H&P (Signed)
History and Physical    Travis Malone IRJ:[id number] DOB: [birth year] DOA: 09/11/2019  PCP: System, Pcp Not In  Patient coming from: Home  Chief Complaint: Shortness of breath  HPI: Travis Malone is a 49 y.o. male with medical history significant of hypertension comes in with shortness of breath worsening over the last several days.  Patient Covid positive a week and a half ago from outside facility has results on his cell phone.  Chest x-ray shows multifocal pneumonia.  Patient referred for admission for Covid pneumonia causing acute hypoxia respiratory failure.  Also having some nausea.  Currently on room air with normal oxygen sats.  Review of Systems: As per HPI otherwise 10 point review of systems negative.   Past Medical History:  Diagnosis Date  . Hypertension     History reviewed. No pertinent surgical history.   reports that he has never smoked. He has never used smokeless tobacco. He reports that he does not drink alcohol. No history on file for drug.  No Known Allergies  History reviewed. No pertinent family history.  No premature coronary artery disease  Prior to Admission medications   Medication Sig Start Date End Date Taking? Authorizing Provider  amLODipine (NORVASC) 5 MG tablet Take 5 mg by mouth daily.    [provider]  metoprolol succinate (TOPROL-XL) 50 MG 24 hr tablet Take 50 mg by mouth daily. Take with or immediately following a meal.    [provider]    Physical Exam: Vitals:   09/11/19 2100 09/11/19 2130 09/11/19 2146 09/12/19 0020  BP: (!) 135/93 (!) 146/89 137/86 (!) 158/91  Pulse: 100 (!) 102 96 97  Resp: (!) 26 (!) 22 16 18   Temp:   98.7 F (37.1 C) 97.9 F (36.6 C)  TempSrc:   Oral Oral  SpO2: 94% 94% 95% 97%  Weight:    122.9 kg  Height:    6' (1.829 m)      Constitutional: NAD, calm, comfortable Vitals:   09/11/19 2100 09/11/19 2130 09/11/19 2146 09/12/19 0020  BP: (!) 135/93 (!) 146/89 137/86 (!) 158/91    Pulse: 100 (!) 102 96 97  Resp: (!) 26 (!) 22 16 18   Temp:   98.7 F (37.1 C) 97.9 F (36.6 C)  TempSrc:   Oral Oral  SpO2: 94% 94% 95% 97%  Weight:    122.9 kg  Height:    6' (1.829 m)   Eyes: PERRL, lids and conjunctivae normal ENMT: Mucous membranes are moist. Posterior pharynx clear of any exudate or lesions.Normal dentition.  Neck: normal, supple, no masses, no thyromegaly Respiratory: clear to auscultation bilaterally, no wheezing, no crackles. Normal respiratory effort. No accessory muscle use.  Cardiovascular: Regular rate and rhythm, no murmurs / rubs / gallops. No extremity edema. 2+ pedal pulses. No carotid bruits.  Abdomen: no tenderness, no masses palpated. No hepatosplenomegaly. Bowel sounds positive.  Musculoskeletal: no clubbing / cyanosis. No joint deformity upper and lower extremities. Good ROM, no contractures. Normal muscle tone.  Skin: no rashes, lesions, ulcers. No induration Neurologic: CN 2-12 grossly intact. Sensation intact, DTR normal. Strength 5/5 in all 4.  Psychiatric: Normal judgment and insight. Alert and oriented x 3. Normal mood.    Labs on Admission: I have personally reviewed following labs and imaging studies  CBC: Recent Labs  Lab 09/11/19 1107  WBC 5.5  NEUTROABS 3.4  HGB 14.7  HCT 41.3  MCV 86.4  PLT 301   Basic Metabolic Panel: Recent  Labs  Lab 09/11/19 1107  NA 136  K 3.7  CL 100  CO2 26  GLUCOSE 91  BUN 12  CREATININE 1.26*  CALCIUM 8.4*   GFR: Estimated Creatinine Clearance: 99.2 mL/min (A) (by C-G formula based on SCr of 1.26 mg/dL (H)). Liver Function Tests: Recent Labs  Lab 09/11/19 1107  AST 34  ALT 26  ALKPHOS 48  BILITOT 0.8  PROT 7.5  ALBUMIN 3.3*   No results for input(s): LIPASE, AMYLASE in the last 168 hours. No results for input(s): AMMONIA in the last 168 hours. Coagulation Profile: No results for input(s): INR, PROTIME in the last 168 hours. Cardiac Enzymes: No results for input(s): CKTOTAL,  CKMB, CKMBINDEX, TROPONINI in the last 168 hours. BNP (last 3 results) No results for input(s): PROBNP in the last 8760 hours. HbA1C: No results for input(s): HGBA1C in the last 72 hours. CBG: No results for input(s): GLUCAP in the last 168 hours. Lipid Profile: Recent Labs    09/11/19 1107  TRIG 150*   Thyroid Function Tests: No results for input(s): TSH, T4TOTAL, FREET4, T3FREE, THYROIDAB in the last 72 hours. Anemia Panel: Recent Labs    09/11/19 1107  FERRITIN 233   Urine analysis: No results found for: COLORURINE, APPEARANCEUR, LABSPEC, PHURINE, GLUCOSEU, HGBUR, BILIRUBINUR, KETONESUR, PROTEINUR, UROBILINOGEN, NITRITE, LEUKOCYTESUR Sepsis Labs: !!!!!!!!!!!!!!!!!!!!!!!!!!!!!!!!!!!!!!!!!!!! @LABRCNTIP (procalcitonin:4,lacticidven:4) )No results found for this or any previous visit (from the past 240 hour(s)).   Radiological Exams on Admission: DG Chest Port 1 View  Result Date: 09/11/2019 CLINICAL DATA:  Cough and congestion.  Reported COVID-19 positive EXAM: PORTABLE CHEST 1 VIEW COMPARISON:  February 11, 2007. FINDINGS: There is patchy airspace opacity in each mid and lower lung zone. Heart is borderline enlarged with pulmonary vascularity normal. No adenopathy. No bone lesions. IMPRESSION: Patchy airspace opacity bilaterally consistent with multifocal pneumonia. Borderline cardiac enlargement. No evident adenopathy. Electronically Signed   By: February 13, 2007 III M.D.   On: 09/11/2019 11:04    Old chart reviewed Chest x-ray with bilateral infiltrates  Assessment/Plan 48 year old male with a history of hypertension comes in with bilateral Covid pneumonia Active Problems:   Acute respiratory disease due to COVID-19 virus-placed on IV remdesivir Decadron vitamin C and zinc due to infiltrate on chest x-ray which is small.  Along with Lovenox.  Supplemental oxygen as needed and wean as tolerates.  However patient is actually currently on room air.  Can likely be discharged later  today if no worsening.  Consider outpatient remdesivir set up.    Pneumonia   Pneumonia due to COVID-19 virus-as above     DVT prophylaxis: Lovenox Code Status: Full Family Communication: None Disposition Plan: 1 to 3 days Consults called: None Admission status: Admission   Travis Malone A MD [facility]  If 7PM-7AM, please contact night-coverage [email or web address] Password [email or web address]  09/12/2019, 3:16 AM

## 2019-09-12 NOTE — Progress Notes (Signed)
ANTICOAGULATION CONSULT NOTE - Initial Consult  Pharmacy Consult for Lovenox Indication: VTE prophylaxis  No Known Allergies  Patient Measurements: Height: 6' (182.9 cm) Weight: 271 lb (122.9 kg) IBW/kg (Calculated) : 77.6  Vital Signs: Temp: 98.3 F (36.8 C) (01/05 1216) Temp Source: Oral (01/05 1216) BP: 144/91 (01/05 1216) Pulse Rate: 94 (01/05 1216)  Labs: Recent Labs    09/11/19 1107 09/12/19 0730  HGB 14.7 14.3  HCT 41.3 38.6*  PLT 275 346  CREATININE 1.26* 1.11  1.09    Estimated Creatinine Clearance: 114.6 mL/min (by C-G formula based on SCr of 1.09 mg/dL).   Medical History: Past Medical History:  Diagnosis Date  . Hypertension     Medications:  Medications Prior to Admission  Medication Sig Dispense Refill Last Dose  . acetaminophen (TYLENOL) 500 MG tablet Take 1,500 mg by mouth 2 (two) times daily as needed (for pain.).   09/10/2019  . amLODipine (NORVASC) 10 MG tablet Take 10 mg by mouth daily.   09/10/2019 at Unknown time  . chlorpheniramine-HYDROcodone (TUSSIONEX) 10-8 MG/5ML SUER Take 5 mLs by mouth every 12 (twelve) hours as needed for cough.   09/10/2019 at Unknown time  . Cholecalciferol (VITAMIN D3 GUMMIES ADULT PO) Take 2 tablets by mouth daily.   09/10/2019  . dextromethorphan-guaiFENesin (MUCINEX DM) 30-600 MG 12hr tablet Take 1 tablet by mouth 2 (two) times daily.   09/10/2019  . DM-Doxylamine-Acetaminophen (NYQUIL HBP COLD & FLU) 15-6.25-325 MG/15ML LIQD Take 15 mLs by mouth at bedtime as needed (cold symptoms).   09/10/2019  . Homeopathic Products (OSCILLOCOCCINUM PO) Take 1 tablet by mouth 2 (two) times daily.   09/10/2019  . ibuprofen (ADVIL) 200 MG tablet Take 600 mg by mouth 2 (two) times daily as needed (for pain.).   09/10/2019  . metoprolol succinate (TOPROL-XL) 100 MG 24 hr tablet Take 100 mg by mouth daily.   09/10/2019 at 0900  . Multiple Vitamins-Minerals (ADULT GUMMY PO) Take 2 tablets by mouth daily.   09/10/2019  . Multiple Vitamins-Minerals  (EMERGEN-C IMMUNE PLUS PO) Take 1 packet by mouth daily. With 4 oz water   09/10/2019  . Pseudoeph-Doxylamine-DM-APAP (NYQUIL PO) Take 2 capsules by mouth at bedtime as needed (cough).   09/10/2019    Assessment: 48 YOM with COVID-19 pneumonia and rising D-dimer. Pharmacy consulted to dose Lovenox per COVID-protocol   H/H stable. Plt wnl    Plan:  -Continue current dose of Lovenox 0.5 mg/kg (60 mg) twice daily per COVID protocol for d-dimer > 5.  -Monitor renal fx and s/s of bleeding   Vinnie Level, PharmD., BCPS Clinical Pharmacist Clinical phone for 09/11/18 until 5pm: [phone]

## 2019-09-12 NOTE — Progress Notes (Signed)
PROGRESS NOTE                                                                                                                                                                                                             Patient Demographics:    Travis Malone, is a 49 y.o. male, DOB - 1970/10/28, EXB:[id number]  Admit date - 09/11/2019   Admitting Physician Ripudeep Jenna Luo, MD  Outpatient Primary MD for the patient is System, Pcp Not In  LOS - 1   Chief Complaint  Patient presents with  . Generalized Body Aches    COVID+       Brief Narrative    This is a no charge note as patient was seen and admitted earlier today.   Subjective:    Travis Malone today ports he is feeling better today, dyspnea is minimal, he still reports some cough   Assessment  & Plan :    Active Problems:   Acute respiratory disease due to COVID-19 virus   Pneumonia   Pneumonia due to COVID-19 virus   Acute  hypoxic respiratory failure due to COVID-19 pneumonia -Initially on 2 L oxygen requirement nasal cannula, chest x-ray significant for multifocal opacities. -He is currently on room air. -Continue with IV steroids. -Continue with IV remdesivir. -Continue to trend inflammatory markers closely given significantly elevated CRP and D-dimers on admission.  Evaded D-dimers. -Was on DVT prophylaxis per COVID-19 protocol, 0.5 mg/kg every 12 hours. -Repeat D-dimers trending up, will obtain venous Dopplers, if positive will proceed with full dose, if negative then will proceed with CTA chest PE protocol.  Hypertension -Continue with metoprolol.   COVID-19 Labs  Recent Labs    09/11/19 1107 09/12/19 0730  DDIMER 5.05* 5.47*  FERRITIN 233  --   LDH 380*  --   CRP 14.2* 12.2*    No results found for: SARSCOV2NAA   Code Status : Full  Family Communication  : D/W patient  Disposition Plan  home    Consults  :  None  Procedures  : None  DVT Prophylaxis  :   lovenox  Lab Results  Component Value Date   PLT 346 09/12/2019    Antibiotics  :   Anti-infectives (From admission, onward)   Start     Dose/Rate Route Frequency Ordered Stop   09/13/19 1000  remdesivir 100 mg in sodium chloride 0.9 % 100 mL IVPB  Status:  Discontinued     100 mg 200 mL/hr over 30 Minutes Intravenous Daily 09/12/19 0315 09/12/19 0320   09/12/19 1000  remdesivir 100 mg in sodium chloride 0.9 % 100 mL IVPB     100 mg 200 mL/hr over 30 Minutes Intravenous Daily 09/11/19 1611 09/16/19 0959   09/12/19 0315  remdesivir 200 mg in sodium chloride 0.9% 250 mL IVPB  Status:  Discontinued     200 mg 580 mL/hr over 30 Minutes Intravenous Once 09/12/19 0315 09/12/19 0320   09/11/19 1600  remdesivir 100 mg in sodium chloride 0.9 % 100 mL IVPB     100 mg 200 mL/hr over 30 Minutes Intravenous Every 1 hr x 2 09/11/19 1508 09/11/19 1823        Objective:   Vitals:   09/12/19 0020 09/12/19 0316 09/12/19 0809 09/12/19 1216  BP: (!) 158/91 132/90 (!) 154/88 (!) 144/91  Pulse: 97 93 96 94  Resp: 18 20 18 18   Temp: 97.9 F (36.6 C) 98.6 F (37 C) 98.7 F (37.1 C) 98.3 F (36.8 C)  TempSrc: Oral Oral Oral Oral  SpO2: 97% 98% 91% 93%  Weight: 122.9 kg     Height: 6' (1.829 m)       Wt Readings from Last 3 Encounters:  09/12/19 122.9 kg  04/14/17 123.4 kg     Intake/Output Summary (Last 24 hours) at 09/12/2019 1616 Last data filed at 09/12/2019 0957 Gross per 24 hour  Intake --  Output 1225 ml  Net -1225 ml     Physical Exam  Awake Alert, Oriented X 3, No new F.N deficits, Normal affect Symmetrical Chest wall movement, Good air movement bilaterally, CTAB RRR,No Gallops,Rubs or new Murmurs, No Parasternal Heave +ve B.Sounds, Abd Soft, No tenderness,No rebound - guarding or rigidity. No Cyanosis, Clubbing or edema, No new Rash or bruise      Data Review:    CBC Recent Labs  Lab 09/11/19 1107 09/12/19 0730  WBC 5.5 5.9  HGB 14.7 14.3  HCT 41.3 38.6*   PLT 275 346  MCV 86.4 86.7  MCH 30.8 32.1  MCHC 35.6 37.0*  RDW 13.4 13.5  LYMPHSABS 1.4  --   MONOABS 0.7  --   EOSABS 0.1  --   BASOSABS 0.0  --     Chemistries  Recent Labs  Lab 09/11/19 1107 09/12/19 0730  NA 136 141  K 3.7 4.1  CL 100 106  CO2 26 25  GLUCOSE 91 99  BUN 12 14  CREATININE 1.26* 1.11  1.09  CALCIUM 8.4* 8.8*  AST 34 49*  ALT 26 39  ALKPHOS 48 48  BILITOT 0.8 0.8   ------------------------------------------------------------------------------------------------------------------ Recent Labs    09/11/19 1107  TRIG 150*    No results found for: HGBA1C ------------------------------------------------------------------------------------------------------------------ No results for input(s): TSH, T4TOTAL, T3FREE, THYROIDAB in the last 72 hours.  Invalid input(s): FREET3 ------------------------------------------------------------------------------------------------------------------ Recent Labs    09/11/19 1107  FERRITIN 233    Coagulation profile No results for input(s): INR, PROTIME in the last 168 hours.  Recent Labs    09/11/19 1107 09/12/19 0730  DDIMER 5.05* 5.47*    Cardiac Enzymes No results for input(s): CKMB, TROPONINI, MYOGLOBIN in the last 168 hours.  Invalid input(s): CK ------------------------------------------------------------------------------------------------------------------ No results found for: BNP  Inpatient Medications  Scheduled Meds: . albuterol  5 mg Nebulization Once  . amLODipine  5 mg Oral Daily  .  vitamin C  500 mg Oral Daily  . dexamethasone (DECADRON) injection  6 mg Intravenous Q24H  . enoxaparin (LOVENOX) injection  60 mg Subcutaneous Q12H  . metoprolol succinate  50 mg Oral Daily  . sodium chloride flush  3 mL Intravenous Q12H  . zinc sulfate  220 mg Oral Daily   Continuous Infusions: . sodium chloride    . remdesivir 100 mg in NS 100 mL 100 mg (09/12/19 1023)   PRN Meds:.sodium  chloride, acetaminophen, chlorpheniramine-HYDROcodone, guaiFENesin-dextromethorphan, ondansetron **OR** ondansetron (ZOFRAN) IV, sodium chloride flush  Micro Results Recent Results (from the past 240 hour(s))  Blood Culture (routine x 2)     Status: None (Preliminary result)   Collection Time: 09/11/19 11:07 AM   Specimen: Left Antecubital; Blood  Result Value Ref Range Status   Specimen Description   Final    LEFT ANTECUBITAL Performed at [facility], [address] [place], [place] [address]    Special Requests   Final    BOTTLES DRAWN AEROBIC AND ANAEROBIC Blood Culture adequate volume Performed at [facility], [address] [place], [place] [address]    Culture   Final    NO GROWTH < 24 HOURS Performed at [facility], [place] [address] [place], [place] [address]    Report Status PENDING  Incomplete  Blood Culture (routine x 2)     Status: None (Preliminary result)   Collection Time: 09/11/19 11:25 AM   Specimen: Right Antecubital; Blood  Result Value Ref Range Status   Specimen Description   Final    RIGHT ANTECUBITAL Performed at [facility], [address] [place], [place] [address]    Special Requests   Final    BOTTLES DRAWN AEROBIC AND ANAEROBIC Blood Culture adequate volume Performed at [facility], [address] [place], [place] [address]    Culture   Final    NO GROWTH < 24 HOURS Performed at [facility], [place] [address] [place], [place] [address]    Report Status PENDING  Incomplete    Radiology Reports DG Chest Port 1 View  Result Date: 09/11/2019 CLINICAL DATA:  Cough and congestion.  Reported COVID-19 positive EXAM: PORTABLE CHEST 1 VIEW COMPARISON:  February 11, 2007. FINDINGS: There is patchy airspace opacity in each mid and lower lung zone. Heart is borderline enlarged with pulmonary vascularity normal. No adenopathy. No bone lesions. IMPRESSION: Patchy airspace opacity bilaterally consistent  with multifocal pneumonia. Borderline cardiac enlargement. No evident adenopathy. Electronically Signed   By: Lowella Grip III M.D.   On: 09/11/2019 11:04    Time Spent in minutes  : no charge   Phillips Climes M.D on 09/12/2019 at 4:16 PM  Between 7am to 7pm - Pager - [phone]  After 7pm go to [email or web address] - password [facility]  [facility] -  Office  [phone]

## 2019-09-12 NOTE — Progress Notes (Addendum)
Patient scheduled for outpatient Remdesivir infusion at 10AM on Thursday 1/7 and Friday 10/8  Please advise them to report to [facility] at [address].  Drive to the security guard and tell them you are here for an infusion. They will direct you to the front entrance where we will come and get you.  For questions call [phone].  Thanks

## 2019-09-12 NOTE — Progress Notes (Signed)
Doctor paged twice via Amnion regarding new admission orders.   Patient observed semi fowler on room air saturation 92%. No signs of irregular breathing. No sign of labored breathing.

## 2019-09-12 NOTE — Plan of Care (Signed)
  Problem: Activity: Goal: Ability to tolerate increased activity will improve Outcome: Adequate for Discharge   Problem: Clinical Measurements: Goal: Ability to maintain a body temperature in the normal range will improve Outcome: Adequate for Discharge   Problem: Respiratory: Goal: Ability to maintain adequate ventilation will improve Outcome: Adequate for Discharge Goal: Ability to maintain a clear airway will improve Outcome: Adequate for Discharge   

## 2019-09-12 NOTE — Plan of Care (Signed)
  Problem: Education: Goal: Knowledge of risk factors and measures for prevention of condition will improve Outcome: Progressing   Problem: Coping: Goal: Psychosocial and spiritual needs will be supported Outcome: Progressing   Problem: Respiratory: Goal: Will maintain a patent airway Outcome: Progressing Goal: Complications related to the disease process, condition or treatment will be avoided or minimized Outcome: Progressing   

## 2019-09-13 ENCOUNTER — Inpatient Hospital Stay (HOSPITAL_COMMUNITY): Payer: BC Managed Care – PPO

## 2019-09-13 ENCOUNTER — Ambulatory Visit (HOSPITAL_COMMUNITY): Payer: BC Managed Care – PPO

## 2019-09-13 DIAGNOSIS — J069 Acute upper respiratory infection, unspecified: Secondary | ICD-10-CM

## 2019-09-13 DIAGNOSIS — R7989 Other specified abnormal findings of blood chemistry: Secondary | ICD-10-CM

## 2019-09-13 DIAGNOSIS — U071 COVID-19: Secondary | ICD-10-CM

## 2019-09-13 LAB — COMPREHENSIVE METABOLIC PANEL
ALT: 38 U/L (ref 0–44)
AST: 31 U/L (ref 15–41)
Albumin: 3.3 g/dL — ABNORMAL LOW (ref 3.5–5.0)
Alkaline Phosphatase: 50 U/L (ref 38–126)
Anion gap: 11 (ref 5–15)
BUN: 19 mg/dL (ref 6–20)
CO2: 23 mmol/L (ref 22–32)
Calcium: 8.9 mg/dL (ref 8.9–10.3)
Chloride: 105 mmol/L (ref 98–111)
Creatinine, Ser: 1.24 mg/dL (ref 0.61–1.24)
GFR calc Af Amer: >60 mL/min (ref >60)
GFR calc non Af Amer: >60 mL/min (ref >60)
Glucose, Bld: 110 mg/dL — ABNORMAL HIGH (ref 70–99)
Potassium: 4.4 mmol/L (ref 3.5–5.1)
Sodium: 139 mmol/L (ref 135–145)
Total Bilirubin: 0.7 mg/dL (ref 0.3–1.2)
Total Protein: 7.5 g/dL (ref 6.5–8.1)

## 2019-09-13 LAB — CBC WITH DIFFERENTIAL/PLATELET
Abs Immature Granulocytes: 0.1 10*3/uL — ABNORMAL HIGH (ref 0.00–0.07)
Basophils Absolute: 0 10*3/uL (ref 0.0–0.1)
Basophils Relative: 0 %
Eosinophils Absolute: 0 10*3/uL (ref 0.0–0.5)
Eosinophils Relative: 0 %
HCT: 40.7 % (ref 39.0–52.0)
Hemoglobin: 14.3 g/dL (ref 13.0–17.0)
Immature Granulocytes: 1 %
Lymphocytes Relative: 18 %
Lymphs Abs: 1.7 10*3/uL (ref 0.7–4.0)
MCH: 30.1 pg (ref 26.0–34.0)
MCHC: 35.1 g/dL (ref 30.0–36.0)
MCV: 85.7 fL (ref 80.0–100.0)
Monocytes Absolute: 1 10*3/uL (ref 0.1–1.0)
Monocytes Relative: 10 %
Neutro Abs: 6.9 10*3/uL (ref 1.7–7.7)
Neutrophils Relative %: 71 %
Platelets: 437 10*3/uL — ABNORMAL HIGH (ref 150–400)
RBC: 4.75 MIL/uL (ref 4.22–5.81)
RDW: 13.5 % (ref 11.5–15.5)
WBC: 9.8 10*3/uL (ref 4.0–10.5)
nRBC: 0 % (ref 0.0–0.2)

## 2019-09-13 LAB — C-REACTIVE PROTEIN: CRP: 6.7 mg/dL — ABNORMAL HIGH (ref <1.0)

## 2019-09-13 LAB — D-DIMER, QUANTITATIVE: D-Dimer, Quant: 2.77 ug/mL-FEU — ABNORMAL HIGH (ref 0.00–0.50)

## 2019-09-13 MED ORDER — PREDNISONE 10 MG PO TABS: ORAL_TABLET | ORAL | 0 refills | Status: AC

## 2019-09-13 MED ORDER — IOHEXOL 350 MG/ML SOLN
100.0000 mL | Freq: Once | INTRAVENOUS | Status: AC | PRN
  Administered 2019-09-13: 100 mL via INTRAVENOUS

## 2019-09-13 NOTE — Progress Notes (Signed)
Pt d/c for home. D/c forms were given, explained and pt understood protocols to be carried out post d/c. walked off floor with belongings.No complaints voiced.

## 2019-09-13 NOTE — Discharge Summary (Signed)
PATIENT DETAILS Name: Travis Malone Age: 49 y.o. Sex: male Date of Birth: May 31, 1971 MRN: [id number]. Admitting Physician: Cathren Harsh, MD WUJ:[id number], Pcp Not In  Admit Date: 09/11/2019 Discharge date: 09/13/2019  Recommendations for Outpatient Follow-up:  1. Follow up with PCP in 1-2 weeks 2. Please obtain CMP/CBC in one week 3. Repeat Chest Xray in 4-6 week   Admitted From:  Home  Disposition: Home   Home Health: No  Equipment/Devices: None  Discharge Condition: Stable  CODE STATUS: FULL CODE  Diet recommendation:  Diet Order            Diet - low sodium heart healthy        Diet Heart Room service appropriate? Yes; Fluid consistency: Thin  Diet effective now               Brief Summary: See H&P, Labs, Consult and Test reports for all details in brief, patient is a 49 year old male with history of HTN-who presented with shortness of breath, he was found to have acute hypoxic respiratory failure secondary to COVID-19 pneumonia.  He was admitted to the hospital service-he was started on steroids and remdesivir with rapid improvement-he is now on room air.  He is being discharged in a stable manner-he will follow up with the outpatient Remdesivir infusion center for 2 more doses of remdesivir.  Brief Hospital Course: Acute Hypoxic Respiratory Failure secondary to COVID-19 pneumonia: Hypoxia has resolved-patient is on room air-he will continue with 2 more doses of remdesivir in the outpatient setting at the infusion center.  Continue tapering steroids on discharge.  COVID-19 Labs:  Recent Labs    09/11/19 1107 09/12/19 0730 09/13/19 0420  DDIMER 5.05* 5.47* 2.77*  FERRITIN 233  --   --   LDH 380*  --   --   CRP 14.2* 12.2* 6.7*    No results found for: SARSCOV2NAA   COVID-19 Medications: Steroids: 1/4>> Remdesivir: 1/4 >>  Elevated D-dimer: Now downtrending-lower extremity Doppler and CTA chest negative for VTE.  Continue ASA 81 mg x 2 weeks  on discharge.  HTN: Controlled-continue metoprolol and amlodipine  Obesity: Estimated body mass index is 36.75 kg/m as calculated from the following:   Height as of this encounter: 6' (1.829 m).   Weight as of this encounter: 122.9 kg.     Procedures None  Discharge Diagnoses:  Active Problems:   Acute respiratory disease due to COVID-19 virus   Pneumonia   Pneumonia due to COVID-19 virus   Discharge Instructions:    Person Under Monitoring Name: Travis Malone  Location: [address] [place] [address]   Infection Prevention Recommendations for Individuals Confirmed to have, or Being Evaluated for, 2019 Novel Coronavirus (COVID-19) Infection Who Receive Care at Home  Individuals who are confirmed to have, or are being evaluated for, COVID-19 should follow the prevention steps below until a healthcare provider or local or state health department says they can return to normal activities.  Stay home except to get medical care You should restrict activities outside your home, except for getting medical care. Do not go to work, school, or public areas, and do not use public transportation or taxis.  Call ahead before visiting your doctor Before your medical appointment, call the healthcare provider and tell them that you have, or are being evaluated for, COVID-19 infection. This will help the healthcare provider's office take steps to keep other people from getting infected. Ask your healthcare provider to call  the local or state health department.  Monitor your symptoms Seek prompt medical attention if your illness is worsening (e.g., difficulty breathing). Before going to your medical appointment, call the healthcare provider and tell them that you have, or are being evaluated for, COVID-19 infection. Ask your healthcare provider to call the local or state health department.  Wear a facemask You should wear a facemask that covers your nose and mouth when  you are in the same room with other people and when you visit a healthcare provider. People who live with or visit you should also wear a facemask while they are in the same room with you.  Separate yourself from other people in your home As much as possible, you should stay in a different room from other people in your home. Also, you should use a separate bathroom, if available.  Avoid sharing household items You should not share dishes, drinking glasses, cups, eating utensils, towels, bedding, or other items with other people in your home. After using these items, you should wash them thoroughly with soap and water.  Cover your coughs and sneezes Cover your mouth and nose with a tissue when you cough or sneeze, or you can cough or sneeze into your sleeve. Throw used tissues in a lined trash can, and immediately wash your hands with soap and water for at least 20 seconds or use an alcohol-based hand rub.  Wash your [employer] your hands often and thoroughly with soap and water for at least 20 seconds. You can use an alcohol-based hand sanitizer if soap and water are not available and if your hands are not visibly dirty. Avoid touching your eyes, nose, and mouth with unwashed hands.   Prevention Steps for Caregivers and Household Members of Individuals Confirmed to have, or Being Evaluated for, COVID-19 Infection Being Cared for in the Home  If you live with, or provide care at home for, a person confirmed to have, or being evaluated for, COVID-19 infection please follow these guidelines to prevent infection:  Follow healthcare provider's instructions Make sure that you understand and can help the patient follow any healthcare provider instructions for all care.  Provide for the patient's basic needs You should help the patient with basic needs in the home and provide support for getting groceries, prescriptions, and other personal needs.  Monitor the patient's symptoms If they  are getting sicker, call his or her medical provider and tell them that the patient has, or is being evaluated for, COVID-19 infection. This will help the healthcare provider's office take steps to keep other people from getting infected. Ask the healthcare provider to call the local or state health department.  Limit the number of people who have contact with the patient  If possible, have only one caregiver for the patient.  Other household members should stay in another home or place of residence. If this is not possible, they should stay  in another room, or be separated from the patient as much as possible. Use a separate bathroom, if available.  Restrict visitors who do not have an essential need to be in the home.  Keep older adults, very young children, and other sick people away from the patient Keep older adults, very young children, and those who have compromised immune systems or chronic health conditions away from the patient. This includes people with chronic heart, lung, or kidney conditions, diabetes, and cancer.  Ensure good ventilation Make sure that shared spaces in the home have good air  flow, such as from an air conditioner or an opened window, weather permitting.  Wash your hands often  Wash your hands often and thoroughly with soap and water for at least 20 seconds. You can use an alcohol based hand sanitizer if soap and water are not available and if your hands are not visibly dirty.  Avoid touching your eyes, nose, and mouth with unwashed hands.  Use disposable paper towels to dry your hands. If not available, use dedicated cloth towels and replace them when they become wet.  Wear a facemask and gloves  Wear a disposable facemask at all times in the room and gloves when you touch or have contact with the patient's blood, body fluids, and/or secretions or excretions, such as sweat, saliva, sputum, nasal mucus, vomit, urine, or feces.  Ensure the mask fits over  your nose and mouth tightly, and do not touch it during use.  Throw out disposable facemasks and gloves after using them. Do not reuse.  Wash your hands immediately after removing your facemask and gloves.  If your personal clothing becomes contaminated, carefully remove clothing and launder. Wash your hands after handling contaminated clothing.  Place all used disposable facemasks, gloves, and other waste in a lined container before disposing them with other household waste.  Remove gloves and wash your hands immediately after handling these items.  Do not share dishes, glasses, or other household items with the patient  Avoid sharing household items. You should not share dishes, drinking glasses, cups, eating utensils, towels, bedding, or other items with a patient who is confirmed to have, or being evaluated for, COVID-19 infection.  After the person uses these items, you should wash them thoroughly with soap and water.  Wash laundry thoroughly  Immediately remove and wash clothes or bedding that have blood, body fluids, and/or secretions or excretions, such as sweat, saliva, sputum, nasal mucus, vomit, urine, or feces, on them.  Wear gloves when handling laundry from the patient.  Read and follow directions on labels of laundry or clothing items and detergent. In general, wash and dry with the warmest temperatures recommended on the label.  Clean all areas the individual has used often  Clean all touchable surfaces, such as counters, tabletops, doorknobs, bathroom fixtures, toilets, phones, keyboards, tablets, and bedside tables, every day. Also, clean any surfaces that may have blood, body fluids, and/or secretions or excretions on them.  Wear gloves when cleaning surfaces the patient has come in contact with.  Use a diluted bleach solution (e.g., dilute bleach with 1 part bleach and 10 parts water) or a household disinfectant with a label that says EPA-registered for  coronaviruses. To make a bleach solution at home, add 1 tablespoon of bleach to 1 quart (4 cups) of water. For a larger supply, add  cup of bleach to 1 gallon (16 cups) of water.  Read labels of cleaning products and follow recommendations provided on product labels. Labels contain instructions for safe and effective use of the cleaning product including precautions you should take when applying the product, such as wearing gloves or eye protection and making sure you have good ventilation during use of the product.  Remove gloves and wash hands immediately after cleaning.  Monitor yourself for signs and symptoms of illness Caregivers and household members are considered close contacts, should monitor their health, and will be asked to limit movement outside of the home to the extent possible. Follow the monitoring steps for close contacts listed on the symptom monitoring form.   ?  If you have additional questions, contact your local health department or call the epidemiologist on call at [phone] (available 24/7). ? This guidance is subject to change. For the most up-to-date guidance from CDC, please refer to their website: [employer]hu    Activity:  As tolerated  Discharge Instructions    Call MD for:  difficulty breathing, headache or visual disturbances   Complete by: As directed    Call MD for:  extreme fatigue   Complete by: As directed    Call MD for:  persistant dizziness or light-headedness   Complete by: As directed    Diet - low sodium heart healthy   Complete by: As directed    Discharge instructions   Complete by: As directed    Follow with Primary MD  in 1-2 weeks  Please get a complete blood count and chemistry panel checked by your Primary MD at your next visit, and again as instructed by your Primary MD.  Get Medicines reviewed and adjusted: Please take all your medications with you for your next  visit with your Primary MD  Laboratory/radiological data: Please request your Primary MD to go over all hospital tests and procedure/radiological results at the follow up, please ask your Primary MD to get all Hospital records sent to his/her office.  In some cases, they will be blood work, cultures and biopsy results pending at the time of your discharge. Please request that your primary care M.D. follows up on these results.  Also Note the following: If you experience worsening of your admission symptoms, develop shortness of breath, life threatening emergency, suicidal or homicidal thoughts you must seek medical attention immediately by calling 911 or calling your MD immediately  if symptoms less severe.  You must read complete instructions/literature along with all the possible adverse reactions/side effects for all the Medicines you take and that have been prescribed to you. Take any new Medicines after you have completely understood and accpet all the possible adverse reactions/side effects.   Do not drive when taking Pain medications or sleeping medications (Benzodaizepines)  Do not take more than prescribed Pain, Sleep and Anxiety Medications. It is not advisable to combine anxiety,sleep and pain medications without talking with your primary care practitioner  Special Instructions: If you have smoked or chewed Tobacco  in the last 2 yrs please stop smoking, stop any regular Alcohol  and or any Recreational drug use.  Wear Seat belts while driving.  Please note: You were cared for by a hospitalist during your hospital stay. Once you are discharged, your primary care physician will handle any further medical issues. Please note that NO REFILLS for any discharge medications will be authorized once you are discharged, as it is imperative that you return to your primary care physician (or establish a relationship with a primary care physician if you do not have one) for your post hospital  discharge needs so that they can reassess your need for medications and monitor your lab values.   1.)  3 weeks of isolation from the day of your first Covid positive test  2.)You are scheduled for outpatient Remdesivir infusion at 10AM on Thursday 1/7 and Friday 10/8  Please  report to [facility] at [address]  Drive to the security guard and tell them you are here for an infusion. They will direct you to the front entrance where we will come and get you.  For questions call [phone].   Increase activity slowly   Complete  by: As directed      Allergies as of 09/13/2019   No Known Allergies     Medication List    STOP taking these medications   chlorpheniramine-HYDROcodone 10-8 MG/5ML Suer Commonly known as: TUSSIONEX   NYQUIL PO     TAKE these medications   acetaminophen 500 MG tablet Commonly known as: TYLENOL Take 1,500 mg by mouth 2 (two) times daily as needed (for pain.).   amLODipine 10 MG tablet Commonly known as: NORVASC Take 10 mg by mouth daily.   dextromethorphan-guaiFENesin 30-600 MG 12hr tablet Commonly known as: MUCINEX DM Take 1 tablet by mouth 2 (two) times daily.   EMERGEN-C IMMUNE PLUS PO Take 1 packet by mouth daily. With 4 oz water   ADULT GUMMY PO Take 2 tablets by mouth daily.   ibuprofen 200 MG tablet Commonly known as: ADVIL Take 600 mg by mouth 2 (two) times daily as needed (for pain.).   metoprolol succinate 100 MG 24 hr tablet Commonly known as: TOPROL-XL Take 100 mg by mouth daily.   NyQuil HBP Cold & Flu 15-6.25-325 MG/15ML Liqd Generic drug: DM-Doxylamine-Acetaminophen Take 15 mLs by mouth at bedtime as needed (cold symptoms).   OSCILLOCOCCINUM PO Take 1 tablet by mouth 2 (two) times daily.   predniSONE 10 MG tablet Commonly known as: DELTASONE Take 40 mg daily for 1 day, 30 mg daily for 1 day, 20 mg daily for 1 days,10 mg daily for 1 day, then stop   VITAMIN D3 GUMMIES ADULT PO Take 2 tablets by mouth  daily.      Follow-up Information    Primary care MD. Schedule an appointment as soon as possible for a visit in 1 week(s).          No Known Allergies   Consultations:   None   Other Procedures/Studies: CT ANGIO CHEST PE W OR WO CONTRAST  Result Date: 09/13/2019 CLINICAL DATA:  Shortness of breath EXAM: CT ANGIOGRAPHY CHEST WITH CONTRAST TECHNIQUE: Multidetector CT imaging of the chest was performed using the standard protocol during bolus administration of intravenous contrast. Multiplanar CT image reconstructions and MIPs were obtained to evaluate the vascular anatomy. CONTRAST:  OMNIPAQUE IOHEXOL 350 MG/ML SOLN COMPARISON:  None. FINDINGS: Cardiovascular: Satisfactory opacification of the pulmonary arteries to the segmental level. No evidence of pulmonary embolism. Normal heart size. No pericardial effusion. Mediastinum/Nodes: Probably reactive nonenlarged mediastinal and hilar lymph nodes. There is no axillary adenopathy. Included thyroid is unremarkable. Esophagus is unremarkable. Lungs/Pleura: Bilateral patchy ground-glass and consolidative opacities. Central airways are patent. No pleural effusion or pneumothorax. Upper Abdomen: No acute abnormality. Musculoskeletal: No chest wall abnormality. No acute or significant osseous findings. Review of the MIP images confirms the above findings. IMPRESSION: No evidence of acute pulmonary embolism. Multifocal pneumonia. Electronically Signed   By: Guadlupe Spanish M.D.   On: 09/13/2019 14:30   DG Chest Port 1 View  Result Date: 09/11/2019 CLINICAL DATA:  Cough and congestion.  Reported COVID-19 positive EXAM: PORTABLE CHEST 1 VIEW COMPARISON:  February 11, 2007. FINDINGS: There is patchy airspace opacity in each mid and lower lung zone. Heart is borderline enlarged with pulmonary vascularity normal. No adenopathy. No bone lesions. IMPRESSION: Patchy airspace opacity bilaterally consistent with multifocal pneumonia. Borderline cardiac  enlargement. No evident adenopathy. Electronically Signed   By: Bretta Bang III M.D.   On: 09/11/2019 11:04   VAS [place] LOWER EXTREMITY VENOUS (DVT)  Result Date: 09/13/2019  Lower Venous Study Indications: Elevated Ddimer.  Risk Factors:  COVID 19 positive. Comparison Study: No prior studies. Performing Technologist: Oliver Hum RVT  Examination Guidelines: A complete evaluation includes B-mode imaging, spectral Doppler, color Doppler, and power Doppler as needed of all accessible portions of each vessel. Bilateral testing is considered an integral part of a complete examination. Limited examinations for reoccurring indications may be performed as noted.  +---------+---------------+---------+-----------+----------+--------------+ RIGHT    CompressibilityPhasicitySpontaneityPropertiesThrombus Aging +---------+---------------+---------+-----------+----------+--------------+ CFV      Full           Yes      Yes                                 +---------+---------------+---------+-----------+----------+--------------+ SFJ      Full                                                        +---------+---------------+---------+-----------+----------+--------------+ FV Prox  Full                                                        +---------+---------------+---------+-----------+----------+--------------+ FV Mid   Full                                                        +---------+---------------+---------+-----------+----------+--------------+ FV DistalFull                                                        +---------+---------------+---------+-----------+----------+--------------+ PFV      Full                                                        +---------+---------------+---------+-----------+----------+--------------+ POP      Full           Yes      Yes                                  +---------+---------------+---------+-----------+----------+--------------+ PTV      Full                                                        +---------+---------------+---------+-----------+----------+--------------+ PERO     Full                                                        +---------+---------------+---------+-----------+----------+--------------+   +---------+---------------+---------+-----------+----------+--------------+  LEFT     CompressibilityPhasicitySpontaneityPropertiesThrombus Aging +---------+---------------+---------+-----------+----------+--------------+ CFV      Full           Yes      Yes                                 +---------+---------------+---------+-----------+----------+--------------+ SFJ      Full                                                        +---------+---------------+---------+-----------+----------+--------------+ FV Prox  Full                                                        +---------+---------------+---------+-----------+----------+--------------+ FV Mid   Full                                                        +---------+---------------+---------+-----------+----------+--------------+ FV DistalFull                                                        +---------+---------------+---------+-----------+----------+--------------+ PFV      Full                                                        +---------+---------------+---------+-----------+----------+--------------+ POP      Full           Yes      Yes                                 +---------+---------------+---------+-----------+----------+--------------+ PTV      Full                                                        +---------+---------------+---------+-----------+----------+--------------+ PERO     Full                                                         +---------+---------------+---------+-----------+----------+--------------+     Summary: Right: There is no evidence of deep vein thrombosis in the lower extremity. No cystic structure found in the popliteal fossa. Left: There is no evidence of deep vein thrombosis in the lower extremity. No cystic structure found in the popliteal fossa.  *  See table(s) above for measurements and observations.    Preliminary      TODAY-DAY OF DISCHARGE:  Subjective:   Travis Malone today has no headache,no chest abdominal pain,no new weakness tingling or numbness, feels much better wants to go home today.   Objective:   Blood pressure 135/82, pulse 74, temperature 98.1 F (36.7 C), temperature source Oral, resp. rate (!) 22, height 6' (1.829 m), weight 122.9 kg, SpO2 95 %.  Intake/Output Summary (Last 24 hours) at 09/13/2019 1615 Last data filed at 09/13/2019 1239 Gross per 24 hour  Intake 700 ml  Output 1975 ml  Net -1275 ml   Filed Weights   09/11/19 1023 09/12/19 0020  Weight: 122.9 kg 122.9 kg    Exam: Awake Alert, Oriented *3, No new F.N deficits, Normal affect [place].AT,PERRAL Supple Neck,No JVD, No cervical lymphadenopathy appriciated.  Symmetrical Chest wall movement, Good air movement bilaterally, CTAB RRR,No Gallops,Rubs or new Murmurs, No Parasternal Heave +ve B.Sounds, Abd Soft, Non tender, No organomegaly appriciated, No rebound -guarding or rigidity. No Cyanosis, Clubbing or edema, No new Rash or bruise   PERTINENT RADIOLOGIC STUDIES: CT ANGIO CHEST PE W OR WO CONTRAST  Result Date: 09/13/2019 CLINICAL DATA:  Shortness of breath EXAM: CT ANGIOGRAPHY CHEST WITH CONTRAST TECHNIQUE: Multidetector CT imaging of the chest was performed using the standard protocol during bolus administration of intravenous contrast. Multiplanar CT image reconstructions and MIPs were obtained to evaluate the vascular anatomy. CONTRAST:  OMNIPAQUE IOHEXOL 350 MG/ML SOLN COMPARISON:  None. FINDINGS:  Cardiovascular: Satisfactory opacification of the pulmonary arteries to the segmental level. No evidence of pulmonary embolism. Normal heart size. No pericardial effusion. Mediastinum/Nodes: Probably reactive nonenlarged mediastinal and hilar lymph nodes. There is no axillary adenopathy. Included thyroid is unremarkable. Esophagus is unremarkable. Lungs/Pleura: Bilateral patchy ground-glass and consolidative opacities. Central airways are patent. No pleural effusion or pneumothorax. Upper Abdomen: No acute abnormality. Musculoskeletal: No chest wall abnormality. No acute or significant osseous findings. Review of the MIP images confirms the above findings. IMPRESSION: No evidence of acute pulmonary embolism. Multifocal pneumonia. Electronically Signed   By: Guadlupe Spanish M.D.   On: 09/13/2019 14:30   DG Chest Port 1 View  Result Date: 09/11/2019 CLINICAL DATA:  Cough and congestion.  Reported COVID-19 positive EXAM: PORTABLE CHEST 1 VIEW COMPARISON:  February 11, 2007. FINDINGS: There is patchy airspace opacity in each mid and lower lung zone. Heart is borderline enlarged with pulmonary vascularity normal. No adenopathy. No bone lesions. IMPRESSION: Patchy airspace opacity bilaterally consistent with multifocal pneumonia. Borderline cardiac enlargement. No evident adenopathy. Electronically Signed   By: Bretta Bang III M.D.   On: 09/11/2019 11:04   VAS [place] LOWER EXTREMITY VENOUS (DVT)  Result Date: 09/13/2019  Lower Venous Study Indications: Elevated Ddimer.  Risk Factors: COVID 19 positive. Comparison Study: No prior studies. Performing Technologist: Chanda Busing RVT  Examination Guidelines: A complete evaluation includes B-mode imaging, spectral Doppler, color Doppler, and power Doppler as needed of all accessible portions of each vessel. Bilateral testing is considered an integral part of a complete examination. Limited examinations for reoccurring indications may be performed as noted.   +---------+---------------+---------+-----------+----------+--------------+ RIGHT    CompressibilityPhasicitySpontaneityPropertiesThrombus Aging +---------+---------------+---------+-----------+----------+--------------+ CFV      Full           Yes      Yes                                 +---------+---------------+---------+-----------+----------+--------------+  SFJ      Full                                                        +---------+---------------+---------+-----------+----------+--------------+ FV Prox  Full                                                        +---------+---------------+---------+-----------+----------+--------------+ FV Mid   Full                                                        +---------+---------------+---------+-----------+----------+--------------+ FV DistalFull                                                        +---------+---------------+---------+-----------+----------+--------------+ PFV      Full                                                        +---------+---------------+---------+-----------+----------+--------------+ POP      Full           Yes      Yes                                 +---------+---------------+---------+-----------+----------+--------------+ PTV      Full                                                        +---------+---------------+---------+-----------+----------+--------------+ PERO     Full                                                        +---------+---------------+---------+-----------+----------+--------------+   +---------+---------------+---------+-----------+----------+--------------+ LEFT     CompressibilityPhasicitySpontaneityPropertiesThrombus Aging +---------+---------------+---------+-----------+----------+--------------+ CFV      Full           Yes      Yes                                  +---------+---------------+---------+-----------+----------+--------------+ SFJ      Full                                                        +---------+---------------+---------+-----------+----------+--------------+  FV Prox  Full                                                        +---------+---------------+---------+-----------+----------+--------------+ FV Mid   Full                                                        +---------+---------------+---------+-----------+----------+--------------+ FV DistalFull                                                        +---------+---------------+---------+-----------+----------+--------------+ PFV      Full                                                        +---------+---------------+---------+-----------+----------+--------------+ POP      Full           Yes      Yes                                 +---------+---------------+---------+-----------+----------+--------------+ PTV      Full                                                        +---------+---------------+---------+-----------+----------+--------------+ PERO     Full                                                        +---------+---------------+---------+-----------+----------+--------------+     Summary: Right: There is no evidence of deep vein thrombosis in the lower extremity. No cystic structure found in the popliteal fossa. Left: There is no evidence of deep vein thrombosis in the lower extremity. No cystic structure found in the popliteal fossa.  *See table(s) above for measurements and observations.    Preliminary      PERTINENT LAB RESULTS: CBC: Recent Labs    09/12/19 0730 09/13/19 0420  WBC 5.9 9.8  HGB 14.3 14.3  HCT 38.6* 40.7  PLT 346 437*   CMET CMP     Component Value Date/Time   NA 139 09/13/2019 0420   K 4.4 09/13/2019 0420   CL 105 09/13/2019 0420   CO2 23 09/13/2019 0420   GLUCOSE 110 (H)  09/13/2019 0420   BUN 19 09/13/2019 0420   CREATININE 1.24 09/13/2019 0420   CALCIUM 8.9 09/13/2019 0420   PROT 7.5 09/13/2019 0420   ALBUMIN 3.3 (L) 09/13/2019 0420   AST 31 09/13/2019 0420   ALT  38 09/13/2019 0420   ALKPHOS 50 09/13/2019 0420   BILITOT 0.7 09/13/2019 0420   GFRNONAA >60 09/13/2019 0420   GFRAA >60 09/13/2019 0420    GFR Estimated Creatinine Clearance: 100.8 mL/min (by C-G formula based on SCr of 1.24 mg/dL). No results for input(s): LIPASE, AMYLASE in the last 72 hours. No results for input(s): CKTOTAL, CKMB, CKMBINDEX, TROPONINI in the last 72 hours. Invalid input(s): POCBNP Recent Labs    09/12/19 0730 09/13/19 0420  DDIMER 5.47* 2.77*   No results for input(s): HGBA1C in the last 72 hours. Recent Labs    09/11/19 1107  TRIG 150*   No results for input(s): TSH, T4TOTAL, T3FREE, THYROIDAB in the last 72 hours.  Invalid input(s): FREET3 Recent Labs    09/11/19 1107  FERRITIN 233   Coags: No results for input(s): INR in the last 72 hours.  Invalid input(s): PT Microbiology: Recent Results (from the past 240 hour(s))  Blood Culture (routine x 2)     Status: None (Preliminary result)   Collection Time: 09/11/19 11:07 AM   Specimen: Left Antecubital; Blood  Result Value Ref Range Status   Specimen Description   Final    LEFT ANTECUBITAL Performed at [facility] [place], [address] [place], [place] [address]    Special Requests   Final    BOTTLES DRAWN AEROBIC AND ANAEROBIC Blood Culture adequate volume Performed at [facility] [place], [address] [place], [place] [address]    Culture   Final    NO GROWTH 2 DAYS Performed at [facility], [address] [place], [place] [address]    Report Status PENDING  Incomplete  Blood Culture (routine x 2)     Status: None (Preliminary result)   Collection Time: 09/11/19 11:25 AM   Specimen: Right Antecubital; Blood  Result Value Ref Range Status   Specimen Description    Final    RIGHT ANTECUBITAL Performed at [facility] [place], [address] [place], [place] [address]    Special Requests   Final    BOTTLES DRAWN AEROBIC AND ANAEROBIC Blood Culture adequate volume Performed at [facility] [place], [address] [place], [place] [address]    Culture   Final    NO GROWTH 2 DAYS Performed at [facility], [address] [place], [place] [address]    Report Status PENDING  Incomplete    FURTHER DISCHARGE INSTRUCTIONS:  Get Medicines reviewed and adjusted: Please take all your medications with you for your next visit with your Primary MD  Laboratory/radiological data: Please request your Primary MD to go over all hospital tests and procedure/radiological results at the follow up, please ask your Primary MD to get all Hospital records sent to his/her office.  In some cases, they will be blood work, cultures and biopsy results pending at the time of your discharge. Please request that your primary care M.D. goes through all the records of your hospital data and follows up on these results.  Also Note the following: If you experience worsening of your admission symptoms, develop shortness of breath, life threatening emergency, suicidal or homicidal thoughts you must seek medical attention immediately by calling 911 or calling your MD immediately  if symptoms less severe.  You must read complete instructions/literature along with all the possible adverse reactions/side effects for all the Medicines you take and that have been prescribed to you. Take any new Medicines after you have completely  understood and accpet all the possible adverse reactions/side effects.   Do not drive when taking Pain medications or sleeping medications (Benzodaizepines)  Do not take more than prescribed Pain, Sleep and Anxiety Medications. It is not advisable to combine anxiety,sleep and pain medications without talking with your primary care  practitioner  Special Instructions: If you have smoked or chewed Tobacco  in the last 2 yrs please stop smoking, stop any regular Alcohol  and or any Recreational drug use.  Wear Seat belts while driving.  Please note: You were cared for by a hospitalist during your hospital stay. Once you are discharged, your primary care physician will handle any further medical issues. Please note that NO REFILLS for any discharge medications will be authorized once you are discharged, as it is imperative that you return to your primary care physician (or establish a relationship with a primary care physician if you do not have one) for your post hospital discharge needs so that they can reassess your need for medications and monitor your lab values.  Total Time spent coordinating discharge including counseling, education and face to face time equals 35 minutes.  SignedJeoffrey Massed 09/13/2019 4:15 PM

## 2019-09-13 NOTE — Discharge Instructions (Signed)
You are scheduled for an outpatient infusion of Remdesivir at 10AM on Thursday 1/7 and Friday 10/8.  Please report to [facility] at [address]  Drive to the security guard and tell them you are here for an infusion. They will direct you to the front entrance where we will come and get you.  For questions call [phone].  Thanks        Person Under Monitoring Name: Travis Malone  Location: [address] [place] [address]   Infection Prevention Recommendations for Individuals Confirmed to have, or Being Evaluated for, 2019 Novel Coronavirus (COVID-19) Infection Who Receive Care at Home  Individuals who are confirmed to have, or are being evaluated for, COVID-19 should follow the prevention steps below until a healthcare provider or local or state health department says they can return to normal activities.  Stay home except to get medical care You should restrict activities outside your home, except for getting medical care. Do not go to work, school, or public areas, and do not use public transportation or taxis.  Call ahead before visiting your doctor Before your medical appointment, call the healthcare provider and tell them that you have, or are being evaluated for, COVID-19 infection. This will help the healthcare provider's office take steps to keep other people from getting infected. Ask your healthcare provider to call the local or state health department.  Monitor your symptoms Seek prompt medical attention if your illness is worsening (e.g., difficulty breathing). Before going to your medical appointment, call the healthcare provider and tell them that you have, or are being evaluated for, COVID-19 infection. Ask your healthcare provider to call the local or state health department.  Wear a facemask You should wear a facemask that covers your nose and mouth when you are in the same room with other people and when you visit a healthcare  provider. People who live with or visit you should also wear a facemask while they are in the same room with you.  Separate yourself from other people in your home As much as possible, you should stay in a different room from other people in your home. Also, you should use a separate bathroom, if available.  Avoid sharing household items You should not share dishes, drinking glasses, cups, eating utensils, towels, bedding, or other items with other people in your home. After using these items, you should wash them thoroughly with soap and water.  Cover your coughs and sneezes Cover your mouth and nose with a tissue when you cough or sneeze, or you can cough or sneeze into your sleeve. Throw used tissues in a lined trash can, and immediately wash your hands with soap and water for at least 20 seconds or use an alcohol-based hand rub.  Wash your [employer] your hands often and thoroughly with soap and water for at least 20 seconds. You can use an alcohol-based hand sanitizer if soap and water are not available and if your hands are not visibly dirty. Avoid touching your eyes, nose, and mouth with unwashed hands.   Prevention Steps for Caregivers and Household Members of Individuals Confirmed to have, or Being Evaluated for, COVID-19 Infection Being Cared for in the Home  If you live with, or provide care at home for, a person confirmed to have, or being evaluated for, COVID-19 infection please follow these guidelines to prevent infection:  Follow healthcare provider's instructions Make sure that you understand and can help the patient follow  any healthcare provider instructions for all care.  Provide for the patient's basic needs You should help the patient with basic needs in the home and provide support for getting groceries, prescriptions, and other personal needs.  Monitor the patient's symptoms If they are getting sicker, call his or her medical provider and tell them that the  patient has, or is being evaluated for, COVID-19 infection. This will help the healthcare provider's office take steps to keep other people from getting infected. Ask the healthcare provider to call the local or state health department.  Limit the number of people who have contact with the patient  If possible, have only one caregiver for the patient.  Other household members should stay in another home or place of residence. If this is not possible, they should stay  in another room, or be separated from the patient as much as possible. Use a separate bathroom, if available.  Restrict visitors who do not have an essential need to be in the home.  Keep older adults, very young children, and other sick people away from the patient Keep older adults, very young children, and those who have compromised immune systems or chronic health conditions away from the patient. This includes people with chronic heart, lung, or kidney conditions, diabetes, and cancer.  Ensure good ventilation Make sure that shared spaces in the home have good air flow, such as from an air conditioner or an opened window, weather permitting.  Wash your hands often  Wash your hands often and thoroughly with soap and water for at least 20 seconds. You can use an alcohol based hand sanitizer if soap and water are not available and if your hands are not visibly dirty.  Avoid touching your eyes, nose, and mouth with unwashed hands.  Use disposable paper towels to dry your hands. If not available, use dedicated cloth towels and replace them when they become wet.  Wear a facemask and gloves  Wear a disposable facemask at all times in the room and gloves when you touch or have contact with the patient's blood, body fluids, and/or secretions or excretions, such as sweat, saliva, sputum, nasal mucus, vomit, urine, or feces.  Ensure the mask fits over your nose and mouth tightly, and do not touch it during use.  Throw out  disposable facemasks and gloves after using them. Do not reuse.  Wash your hands immediately after removing your facemask and gloves.  If your personal clothing becomes contaminated, carefully remove clothing and launder. Wash your hands after handling contaminated clothing.  Place all used disposable facemasks, gloves, and other waste in a lined container before disposing them with other household waste.  Remove gloves and wash your hands immediately after handling these items.  Do not share dishes, glasses, or other household items with the patient  Avoid sharing household items. You should not share dishes, drinking glasses, cups, eating utensils, towels, bedding, or other items with a patient who is confirmed to have, or being evaluated for, COVID-19 infection.  After the person uses these items, you should wash them thoroughly with soap and water.  Wash laundry thoroughly  Immediately remove and wash clothes or bedding that have blood, body fluids, and/or secretions or excretions, such as sweat, saliva, sputum, nasal mucus, vomit, urine, or feces, on them.  Wear gloves when handling laundry from the patient.  Read and follow directions on labels of laundry or clothing items and detergent. In general, wash and dry with the warmest temperatures recommended on  the label.  Clean all areas the individual has used often  Clean all touchable surfaces, such as counters, tabletops, doorknobs, bathroom fixtures, toilets, phones, keyboards, tablets, and bedside tables, every day. Also, clean any surfaces that may have blood, body fluids, and/or secretions or excretions on them.  Wear gloves when cleaning surfaces the patient has come in contact with.  Use a diluted bleach solution (e.g., dilute bleach with 1 part bleach and 10 parts water) or a household disinfectant with a label that says EPA-registered for coronaviruses. To make a bleach solution at home, add 1 tablespoon of bleach to 1  quart (4 cups) of water. For a larger supply, add  cup of bleach to 1 gallon (16 cups) of water.  Read labels of cleaning products and follow recommendations provided on product labels. Labels contain instructions for safe and effective use of the cleaning product including precautions you should take when applying the product, such as wearing gloves or eye protection and making sure you have good ventilation during use of the product.  Remove gloves and wash hands immediately after cleaning.  Monitor yourself for signs and symptoms of illness Caregivers and household members are considered close contacts, should monitor their health, and will be asked to limit movement outside of the home to the extent possible. Follow the monitoring steps for close contacts listed on the symptom monitoring form.   ? If you have additional questions, contact your local health department or call the epidemiologist on call at [phone] (available 24/7). ? This guidance is subject to change. For the most up-to-date guidance from [facility], please refer to their website: YouBlogs.pl

## 2019-09-13 NOTE — Progress Notes (Signed)
Bilateral lower extremity venous duplex has been completed. Preliminary results can be found in CV Proc through chart review.  Results were given to the patient's nurse, Norda.  09/13/19 11:25 AM Olen Cordial RVT

## 2019-09-14 ENCOUNTER — Ambulatory Visit (HOSPITAL_COMMUNITY)
Admission: RE | Admit: 2019-09-14 | Discharge: 2019-09-14 | Disposition: A | Discharge status: Home / Self Care | Payer: BC Managed Care – PPO | Source: Ambulatory Visit | Attending: Pulmonary Disease | Admitting: Pulmonary Disease

## 2019-09-14 DIAGNOSIS — U071 COVID-19: Secondary | ICD-10-CM | POA: Diagnosis present

## 2019-09-14 DIAGNOSIS — J1289 Other viral pneumonia: Secondary | ICD-10-CM | POA: Diagnosis present

## 2019-09-14 MED ORDER — FAMOTIDINE IN NACL 20-0.9 MG/50ML-% IV SOLN: 20.0000 mg | Freq: Once | INTRAVENOUS | Status: DC | PRN

## 2019-09-14 MED ORDER — ALBUTEROL SULFATE HFA 108 (90 BASE) MCG/ACT IN AERS: 2.0000 | INHALATION_SPRAY | Freq: Once | RESPIRATORY_TRACT | Status: DC | PRN

## 2019-09-14 MED ORDER — SODIUM CHLORIDE 0.9 % IV SOLN
INTRAVENOUS | Status: DC | PRN
  Administered 2019-09-14: 250 mL via INTRAVENOUS

## 2019-09-14 MED ORDER — SODIUM CHLORIDE 0.9 % IV SOLN
100.0000 mg | Freq: Once | INTRAVENOUS | Status: AC
  Administered 2019-09-14: 100 mg via INTRAVENOUS

## 2019-09-14 MED ORDER — METHYLPREDNISOLONE SODIUM SUCC 125 MG IJ SOLR: 125.0000 mg | Freq: Once | INTRAMUSCULAR | Status: DC | PRN

## 2019-09-14 MED ORDER — DIPHENHYDRAMINE HCL 50 MG/ML IJ SOLN: 50.0000 mg | Freq: Once | INTRAMUSCULAR | Status: DC | PRN

## 2019-09-14 MED ORDER — EPINEPHRINE 0.3 MG/0.3ML IJ SOAJ: 0.3000 mg | Freq: Once | INTRAMUSCULAR | Status: DC | PRN

## 2019-09-14 MED ORDER — SODIUM CHLORIDE 0.9 % IV SOLN
INTRAVENOUS | Status: AC
  Filled 2019-09-14: qty 20

## 2019-09-14 NOTE — Discharge Instructions (Addendum)

## 2019-09-14 NOTE — Progress Notes (Signed)
  Diagnosis: COVID-19  Physician: dr. Delford Field   Procedure: [facility] Med: remdesivir infusion.  Complications: No immediate complications noted.  Discharge: Discharged home   Fareeha Evon, Luetta Nutting 09/14/2019

## 2019-09-15 ENCOUNTER — Ambulatory Visit (HOSPITAL_COMMUNITY)
Admit: 2019-09-15 | Discharge: 2019-09-15 | Disposition: A | Discharge status: Home / Self Care | Payer: BC Managed Care – PPO | Attending: Pulmonary Disease | Admitting: Pulmonary Disease

## 2019-09-15 DIAGNOSIS — U071 COVID-19: Secondary | ICD-10-CM | POA: Diagnosis not present

## 2019-09-15 MED ORDER — SODIUM CHLORIDE 0.9 % IV SOLN
INTRAVENOUS | Status: AC
  Administered 2019-09-15: 100 mg
  Filled 2019-09-15: qty 20

## 2019-09-16 LAB — CULTURE, BLOOD (ROUTINE X 2)
Culture: NO GROWTH
Culture: NO GROWTH
Special Requests: ADEQUATE
Special Requests: ADEQUATE

## 2019-10-10 ENCOUNTER — Other Ambulatory Visit: Payer: Self-pay

## 2020-10-04 IMAGING — DX DG CHEST 1V PORT
1 series · 1 of 1 positions shown · non-contrast
Comparison: February 11, 2007.

CLINICAL DATA: Cough and congestion.  Reported [id number] positive

EXAM:
PORTABLE CHEST 1 VIEW

[chest ap]
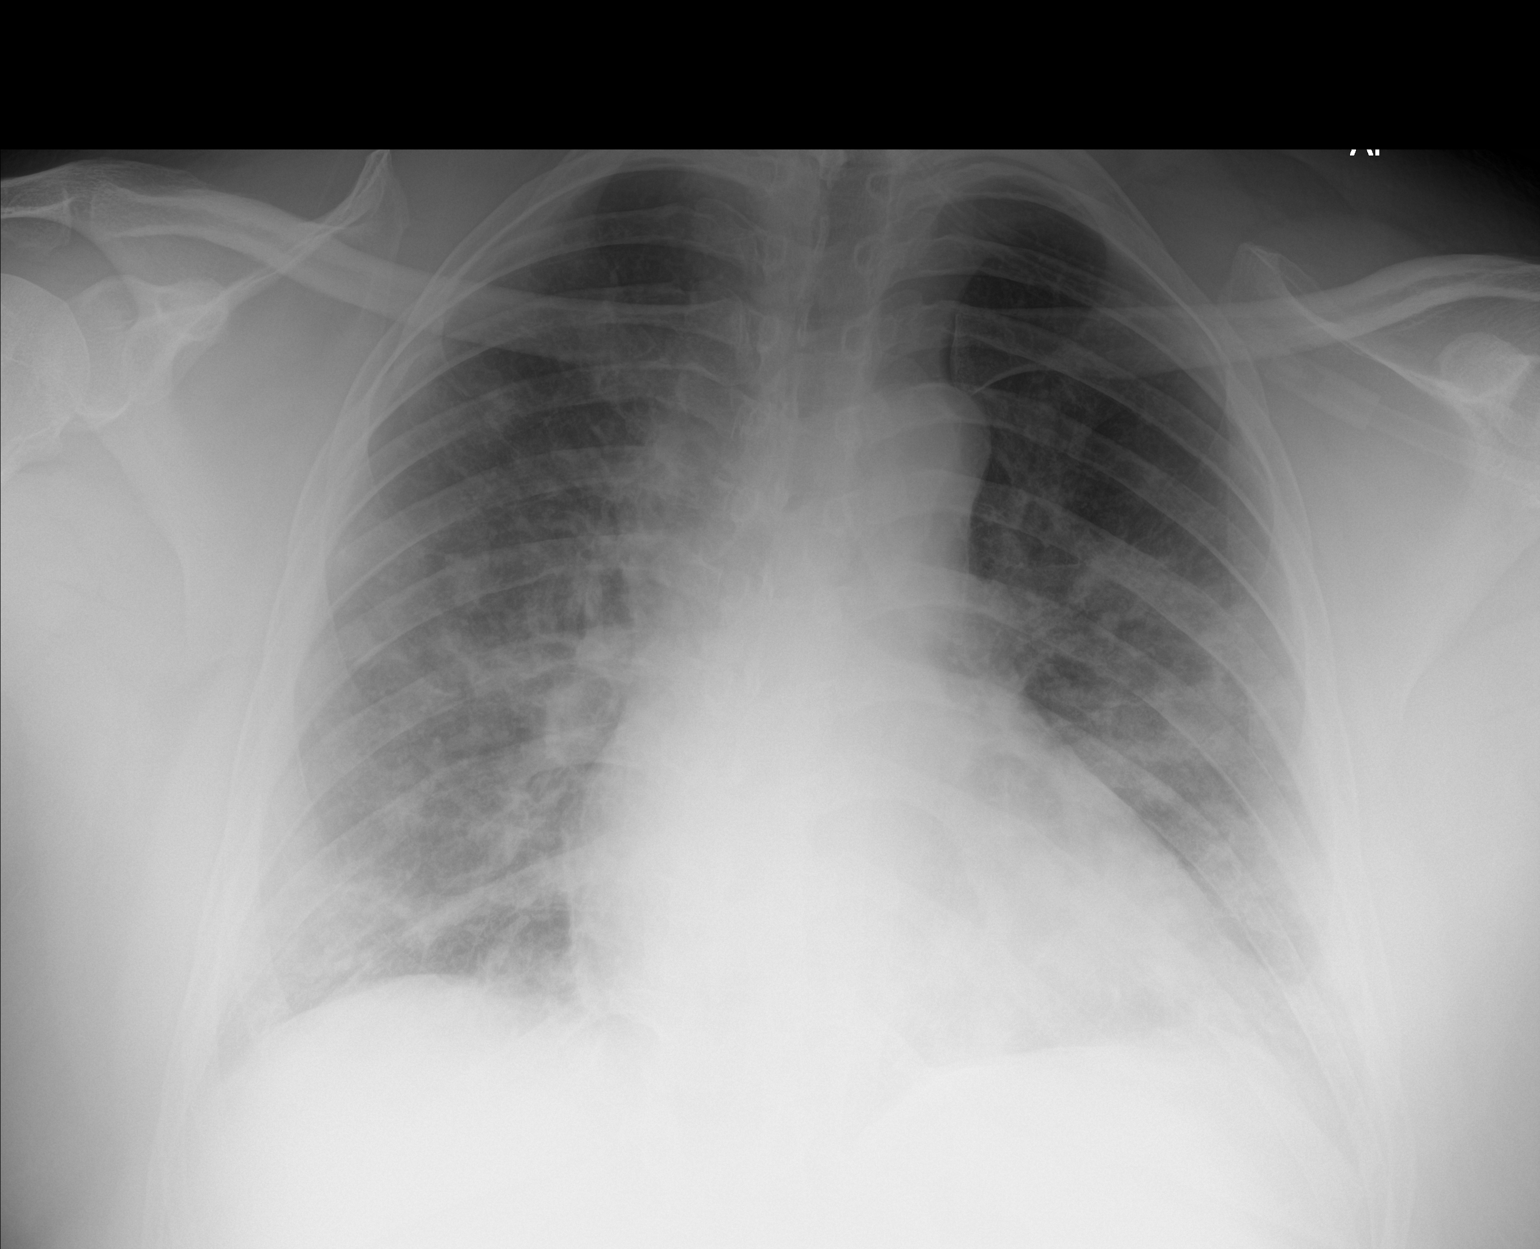

[1 of 1 positions shown; findings below may reference images not displayed]

FINDINGS: There is patchy airspace opacity in each mid and lower lung zone.
Heart is borderline enlarged with pulmonary vascularity normal. No
adenopathy. No bone lesions.
IMPRESSION: Patchy airspace opacity bilaterally consistent with multifocal
pneumonia. Borderline cardiac enlargement. No evident adenopathy.

## 2020-10-06 IMAGING — CT CT ANGIO CHEST
1 of 4 series · 12 of 30 positions shown · IV contrast (OMNIPAQUE)
Comparison: None.

CLINICAL DATA: Shortness of breath

EXAM:
CT ANGIOGRAPHY CHEST WITH CONTRAST
TECHNIQUE: Multidetector CT imaging of the chest was performed using the
standard protocol during bolus administration of intravenous
contrast. Multiplanar CT image reconstructions and MIPs were
obtained to evaluate the vascular anatomy.
CONTRAST:  100mL OMNIPAQUE IOHEXOL 350 MG/ML SOLN

[Series 4: pe chest · axial · 0.68mm/px · z∈[+830,+1092]mm · 12 of 159 slices shown]
[im 14/159  lung]
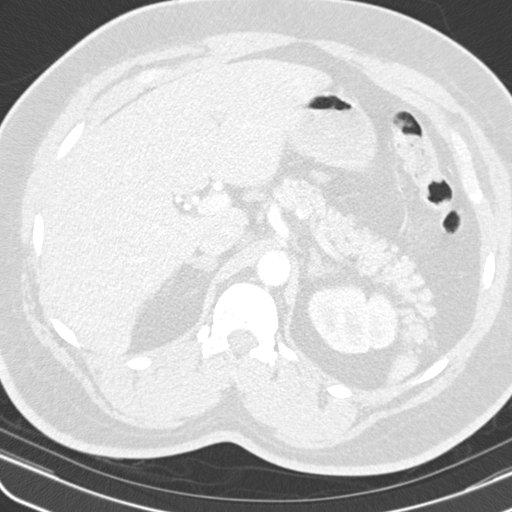
[im 27/159  mediastinal]
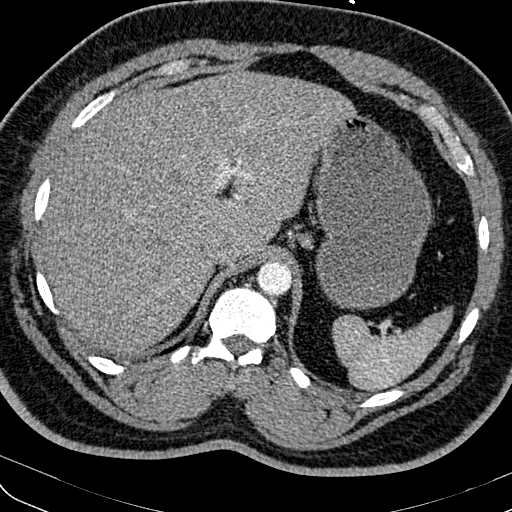
[im 40/159  lung]
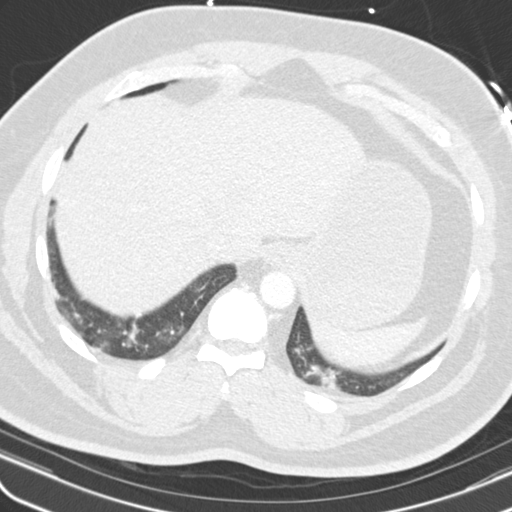
[im 53/159  mediastinal]
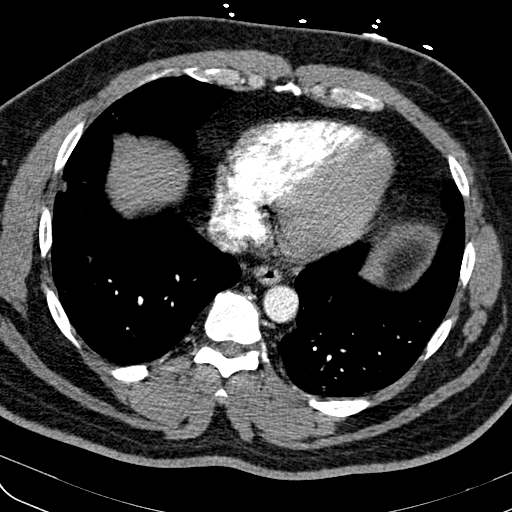
[im 66/159  lung]
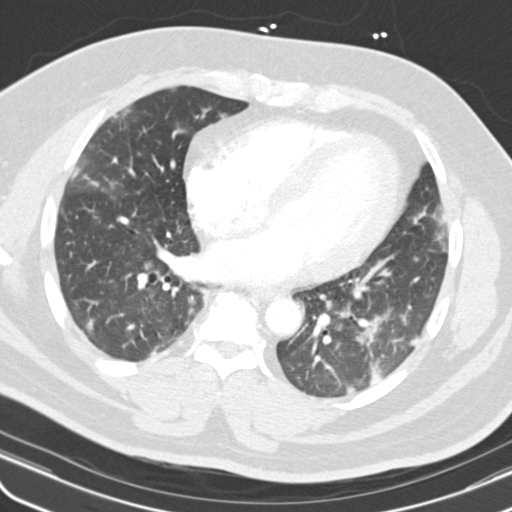
[im 75/159  mediastinal]
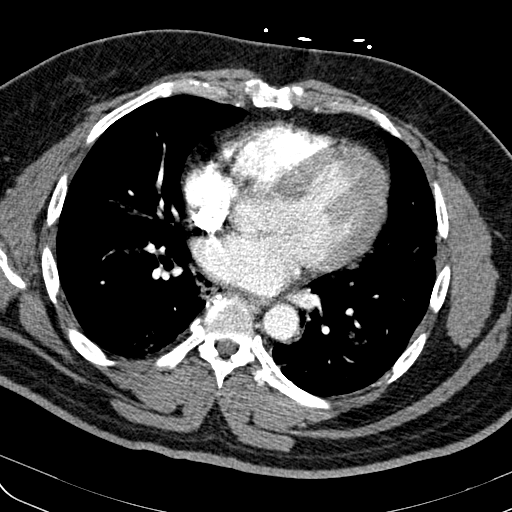
[im 80/159  lung]
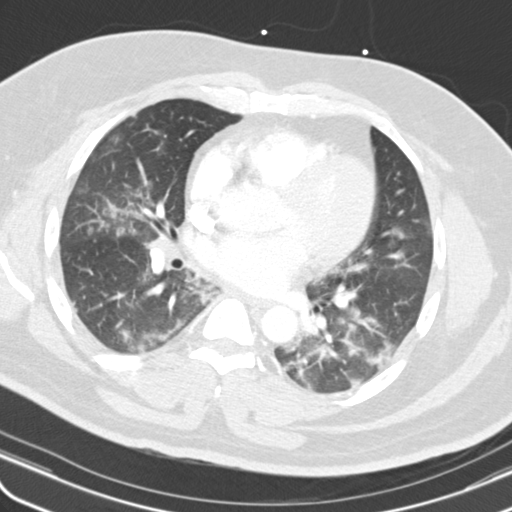
[im 93/159  mediastinal]
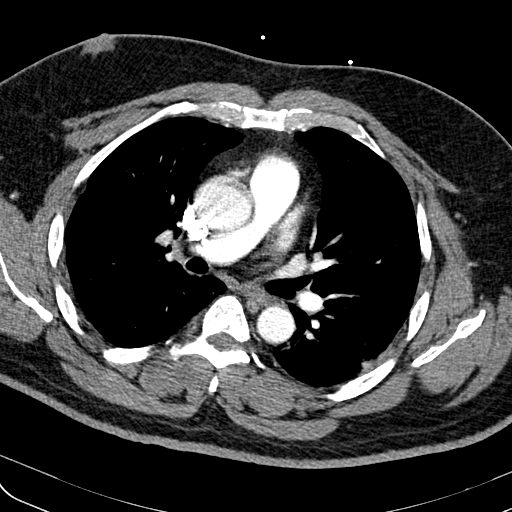
[im 106/159  lung]
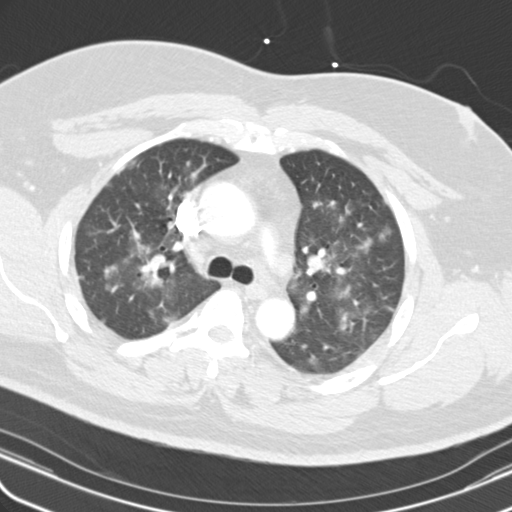
[im 119/159  mediastinal]
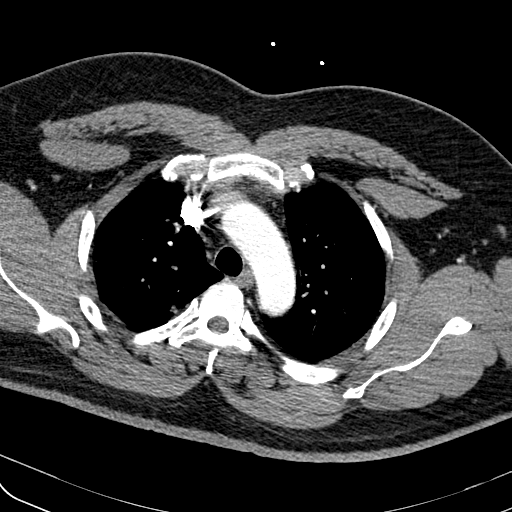
[im 132/159  lung]
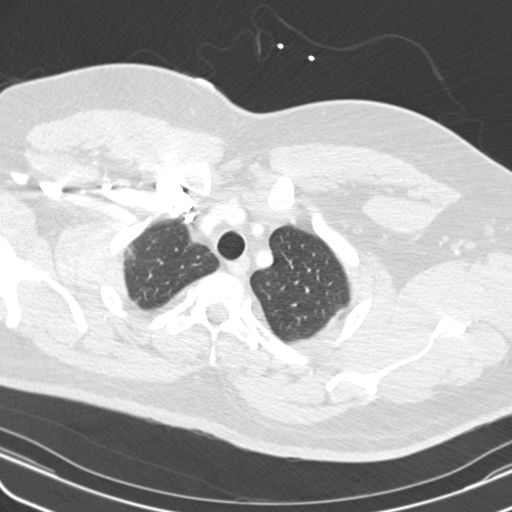
[im 145/159  mediastinal]
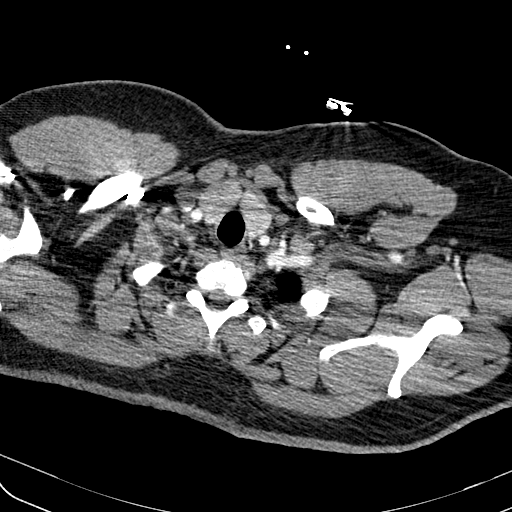

[12 of 30 positions shown; findings below may reference images not displayed]

FINDINGS: Cardiovascular: Satisfactory opacification of the pulmonary arteries
to the segmental level. No evidence of pulmonary embolism. Normal
heart size. No pericardial effusion.

Mediastinum/Nodes: Probably reactive nonenlarged mediastinal and
hilar lymph nodes. There is no axillary adenopathy. Included thyroid
is unremarkable. Esophagus is unremarkable.

Lungs/Pleura: Bilateral patchy ground-glass and consolidative
opacities. Central airways are patent. No pleural effusion or
pneumothorax.

Upper Abdomen: No acute abnormality.

Musculoskeletal: No chest wall abnormality. No acute or significant
osseous findings.

Review of the MIP images confirms the above findings.
IMPRESSION: No evidence of acute pulmonary embolism.

Multifocal pneumonia.

## 2021-02-14 ENCOUNTER — Ambulatory Visit: Payer: BC Managed Care – PPO | Admitting: Family

## 2022-11-26 ENCOUNTER — Encounter (HOSPITAL_BASED_OUTPATIENT_CLINIC_OR_DEPARTMENT_OTHER): Payer: Self-pay | Admitting: Pediatrics

## 2022-11-26 ENCOUNTER — Emergency Department (HOSPITAL_BASED_OUTPATIENT_CLINIC_OR_DEPARTMENT_OTHER): Payer: BC Managed Care – PPO

## 2022-11-26 ENCOUNTER — Other Ambulatory Visit: Payer: Self-pay

## 2022-11-26 ENCOUNTER — Emergency Department (HOSPITAL_BASED_OUTPATIENT_CLINIC_OR_DEPARTMENT_OTHER)
Admission: EM | Admit: 2022-11-26 | Discharge: 2022-11-26 | Disposition: A | Discharge status: Home / Self Care | Payer: BC Managed Care – PPO | Attending: Emergency Medicine | Admitting: Emergency Medicine

## 2022-11-26 DIAGNOSIS — Z79899 Other long term (current) drug therapy: Secondary | ICD-10-CM | POA: Diagnosis not present

## 2022-11-26 DIAGNOSIS — R42 Dizziness and giddiness: Secondary | ICD-10-CM | POA: Insufficient documentation

## 2022-11-26 DIAGNOSIS — E871 Hypo-osmolality and hyponatremia: Secondary | ICD-10-CM | POA: Insufficient documentation

## 2022-11-26 DIAGNOSIS — H5310 Unspecified subjective visual disturbances: Secondary | ICD-10-CM | POA: Diagnosis not present

## 2022-11-26 DIAGNOSIS — I10 Essential (primary) hypertension: Secondary | ICD-10-CM | POA: Diagnosis not present

## 2022-11-26 DIAGNOSIS — R112 Nausea with vomiting, unspecified: Secondary | ICD-10-CM | POA: Insufficient documentation

## 2022-11-26 LAB — CBC WITH DIFFERENTIAL/PLATELET
Abs Immature Granulocytes: 0.02 10*3/uL (ref 0.00–0.07)
Basophils Absolute: 0 10*3/uL (ref 0.0–0.1)
Basophils Relative: 0 %
Eosinophils Absolute: 0 10*3/uL (ref 0.0–0.5)
Eosinophils Relative: 0 %
HCT: 42 % (ref 39.0–52.0)
Hemoglobin: 14.6 g/dL (ref 13.0–17.0)
Immature Granulocytes: 0 %
Lymphocytes Relative: 18 %
Lymphs Abs: 1.4 10*3/uL (ref 0.7–4.0)
MCH: 30 pg (ref 26.0–34.0)
MCHC: 34.8 g/dL (ref 30.0–36.0)
MCV: 86.2 fL (ref 80.0–100.0)
Monocytes Absolute: 0.5 10*3/uL (ref 0.1–1.0)
Monocytes Relative: 7 %
Neutro Abs: 5.6 10*3/uL (ref 1.7–7.7)
Neutrophils Relative %: 75 %
Platelets: 314 10*3/uL (ref 150–400)
RBC: 4.87 MIL/uL (ref 4.22–5.81)
RDW: 12.8 % (ref 11.5–15.5)
WBC: 7.6 10*3/uL (ref 4.0–10.5)
nRBC: 0 % (ref 0.0–0.2)

## 2022-11-26 LAB — COMPREHENSIVE METABOLIC PANEL
ALT: 22 U/L (ref 0–44)
AST: 20 U/L (ref 15–41)
Albumin: 4 g/dL (ref 3.5–5.0)
Alkaline Phosphatase: 40 U/L (ref 38–126)
Anion gap: 6 (ref 5–15)
BUN: 10 mg/dL (ref 6–20)
CO2: 24 mmol/L (ref 22–32)
Calcium: 9 mg/dL (ref 8.9–10.3)
Chloride: 101 mmol/L (ref 98–111)
Creatinine, Ser: 1.6 mg/dL — ABNORMAL HIGH (ref 0.61–1.24)
GFR, Estimated: 52 mL/min — ABNORMAL LOW (ref >60)
Glucose, Bld: 96 mg/dL (ref 70–99)
Potassium: 4.9 mmol/L (ref 3.5–5.1)
Sodium: 131 mmol/L — ABNORMAL LOW (ref 135–145)
Total Bilirubin: 0.7 mg/dL (ref 0.3–1.2)
Total Protein: 7.6 g/dL (ref 6.5–8.1)

## 2022-11-26 MED ORDER — SODIUM CHLORIDE 0.9 % IV BOLUS
1000.0000 mL | Freq: Once | INTRAVENOUS | Status: AC
  Administered 2022-11-26: 1000 mL via INTRAVENOUS

## 2022-11-26 MED ORDER — MECLIZINE HCL 25 MG PO TABS: 25.0000 mg | ORAL_TABLET | Freq: Three times a day (TID) | ORAL | 0 refills | Status: AC | PRN

## 2022-11-26 MED ORDER — IOHEXOL 350 MG/ML SOLN
75.0000 mL | Freq: Once | INTRAVENOUS | Status: AC | PRN
  Administered 2022-11-26: 75 mL via INTRAVENOUS

## 2022-11-26 MED ORDER — MECLIZINE HCL 25 MG PO TABS
50.0000 mg | ORAL_TABLET | Freq: Once | ORAL | Status: AC
  Administered 2022-11-26: 50 mg via ORAL
  Filled 2022-11-26: qty 2

## 2022-11-26 NOTE — ED Triage Notes (Signed)
Reports "I'm spinning" started yesterday morning around 3 am, and now has NV.

## 2022-11-26 NOTE — ED Provider Notes (Signed)
[place] [facility] Provider Note   CSN: [id number] Arrival date & time: 11/26/22  1226     History  Chief Complaint  Patient presents with   Dizziness    KINSLEY JANELLE Hise is a 52 y.o. male.  The history is provided by the patient and medical records. No language interpreter was used.  Dizziness Quality:  Room spinning and head spinning Severity:  Severe Onset quality:  Sudden Duration:  2 days Timing:  Constant Progression:  Waxing and waning Chronicity:  Recurrent (one small episode years ago) Relieved by:  Nothing Worsened by:  Nothing Ineffective treatments:  None tried Associated symptoms: vision changes   Associated symptoms: no chest pain, no diarrhea, no headaches, no nausea, no palpitations, no shortness of breath, no syncope, no tinnitus, no vomiting and no weakness        Home Medications Prior to Admission medications   Medication Sig Start Date End Date Taking? Authorizing Provider  acetaminophen (TYLENOL) 500 MG tablet Take 1,500 mg by mouth 2 (two) times daily as needed (for pain.).    [provider]  amLODipine (NORVASC) 10 MG tablet Take 10 mg by mouth daily. 07/25/19   [provider]  Cholecalciferol (VITAMIN D3 GUMMIES ADULT PO) Take 2 tablets by mouth daily.    [provider]  dextromethorphan-guaiFENesin (MUCINEX DM) 30-600 MG 12hr tablet Take 1 tablet by mouth 2 (two) times daily.    [provider]  DM-Doxylamine-Acetaminophen (NYQUIL HBP COLD & FLU) 15-6.25-325 MG/15ML LIQD Take 15 mLs by mouth at bedtime as needed (cold symptoms).    [provider]  Homeopathic Products (OSCILLOCOCCINUM PO) Take 1 tablet by mouth 2 (two) times daily.    [provider]  ibuprofen (ADVIL) 200 MG tablet Take 600 mg by mouth 2 (two) times daily as needed (for pain.).    [provider]  metoprolol succinate (TOPROL-XL) 100 MG 24 hr tablet Take 100 mg by mouth  daily. 07/25/19   [provider]  Multiple Vitamins-Minerals (ADULT GUMMY PO) Take 2 tablets by mouth daily.    [provider]  Multiple Vitamins-Minerals (EMERGEN-C IMMUNE PLUS PO) Take 1 packet by mouth daily. With 4 oz water    [provider]  predniSONE (DELTASONE) 10 MG tablet Take 40 mg daily for 1 day, 30 mg daily for 1 day, 20 mg daily for 1 days,10 mg daily for 1 day, then stop 09/13/19   Jonetta Osgood, MD      Allergies    Patient has no known allergies.    Review of Systems   Review of Systems  Constitutional:  Negative for chills, fatigue and fever.  HENT:  Negative for congestion and tinnitus.   Eyes:  Positive for visual disturbance (resolved bluriness now).  Respiratory:  Negative for cough, chest tightness, shortness of breath and wheezing.   Cardiovascular:  Negative for chest pain, palpitations and syncope.  Gastrointestinal:  Negative for abdominal pain, diarrhea, nausea and vomiting.  Genitourinary:  Negative for dysuria and flank pain.  Musculoskeletal:  Negative for back pain, neck pain and neck stiffness.  Skin:  Negative for rash and wound.  Neurological:  Positive for dizziness. Negative for seizures, facial asymmetry, weakness, light-headedness, numbness and headaches.  Psychiatric/Behavioral:  Negative for agitation and confusion.   All other systems reviewed and are negative.   Physical Exam Updated Vital Signs BP (!) 124/93 (BP Location: Right Arm)   Pulse 63   Temp 97.6  F (36.4 C) (Oral)   Ht 6' (1.829 m)   Wt 119.7 kg   SpO2 100%   BMI 35.80 kg/m  Physical Exam Vitals and nursing note reviewed.  Constitutional:      General: He is not in acute distress.    Appearance: He is well-developed. He is not ill-appearing, toxic-appearing or diaphoretic.  HENT:     Head: Normocephalic and atraumatic.     Nose: No congestion or rhinorrhea.     Mouth/Throat:     Mouth: Mucous membranes are dry.     Pharynx: No  oropharyngeal exudate.  Eyes:     Extraocular Movements: Extraocular movements intact.     Conjunctiva/sclera: Conjunctivae normal.     Pupils: Pupils are equal, round, and reactive to light.  Neck:     Vascular: No carotid bruit.  Cardiovascular:     Rate and Rhythm: Normal rate and regular rhythm.     Heart sounds: No murmur heard. Pulmonary:     Effort: Pulmonary effort is normal. No respiratory distress.     Breath sounds: Normal breath sounds.  Abdominal:     Palpations: Abdomen is soft.     Tenderness: There is no abdominal tenderness. There is no right CVA tenderness, left CVA tenderness, guarding or rebound.  Musculoskeletal:        General: No swelling or tenderness.     Cervical back: Neck supple. No tenderness.     Right lower leg: No edema.     Left lower leg: No edema.  Skin:    General: Skin is warm and dry.     Capillary Refill: Capillary refill takes less than 2 seconds.     Findings: No erythema or rash.  Neurological:     Mental Status: He is alert.     Sensory: No sensory deficit.     Motor: No weakness.     Comments: Dizziness reported when sitting up.  Otherwise intact sensation, strength, and pulses in extremities.  Normal finger-nose-finger testing bilaterally.  Symmetric smile.  Clear speech.  Psychiatric:        Mood and Affect: Mood normal.     ED Results / Procedures / Treatments   Labs (all labs ordered are listed, but only abnormal results are displayed) Labs Reviewed  COMPREHENSIVE METABOLIC PANEL - Abnormal; Notable for the following components:      Result Value   Sodium 131 (*)    Creatinine, Ser 1.60 (*)    GFR, Estimated 52 (*)    All other components within normal limits  CBC WITH DIFFERENTIAL/PLATELET    EKG EKG Interpretation  Date/Time:  Thursday November 26 2022 15:42:27 EDT Ventricular Rate:  60 PR Interval:  120 QRS Duration: 93 QT Interval:  411 QTC Calculation: 411 R Axis:   69 Text Interpretation: Sinus rhythm  Nonspecific T abnormalities, inferior leads when compared to prior,  similar appearance. No STEMI Confirmed by Antony Blackbird [phone]) on 11/26/2022 4:07:31 PM  Radiology CT ANGIO HEAD NECK W WO CM  Result Date: 11/26/2022 CLINICAL DATA:  Syncope/presyncope, cerebrovascular cause suspected EXAM: CT ANGIOGRAPHY HEAD AND NECK TECHNIQUE: Multidetector CT imaging of the head and neck was performed using the standard protocol during bolus administration of intravenous contrast. Multiplanar CT image reconstructions and MIPs were obtained to evaluate the vascular anatomy. Carotid stenosis measurements (when applicable) are obtained utilizing NASCET criteria, using the distal internal carotid diameter as the denominator. RADIATION DOSE REDUCTION: This exam was performed according to the departmental dose-optimization program which  includes automated exposure control, adjustment of the mA and/or kV according to patient size and/or use of iterative reconstruction technique. CONTRAST:  53mL OMNIPAQUE IOHEXOL 350 MG/ML SOLN COMPARISON:  None Available. FINDINGS: CT HEAD FINDINGS Brain: No evidence of acute infarction, hemorrhage, hydrocephalus, extra-axial collection or mass lesion/mass effect. Vascular: See below. Skull: No acute fracture. Sinuses/Orbits: Clear sinuses.  No acute orbital findings. Other: No mastoid effusions. Review of the MIP images confirms the above findings CTA NECK FINDINGS Limited evaluation due to venous contrast bolus timing. Within this limitation: Aortic arch: Great vessel origins are patent without significant stenosis. Right carotid system: No evidence of dissection, stenosis (50% or greater), or occlusion. Left carotid system: No evidence of dissection, stenosis (50% or greater), or occlusion. Vertebral arteries: Codominant. No evidence of dissection, stenosis (50% or greater), or occlusion. Skeleton: Negative. Other neck: No acute abnormality. Upper chest: Visualized lung apices are clear.  Review of the MIP images confirms the above findings CTA HEAD FINDINGS Limited evaluation due to venous contrast bolus timing. Within this limitation: Anterior circulation: Bilateral intracranial ICAs, MCAs, and ACAs are patent without proximal high-grade stenosis. Posterior circulation: Bilateral intradural vertebral arteries, basilar artery and bilateral posterior cerebral arteries are patent without proximal hemodynamically significant stenosis. Bilateral posterior communicating arteries are present. Venous sinuses: No evidence of dural venous sinus thrombosis. Review of the MIP images confirms the above findings IMPRESSION: 1. No evidence of acute intracranial abnormality. 2. No large vessel occlusion or proximal high-grade stenosis. Electronically Signed   By: Margaretha Sheffield M.D.   On: 11/26/2022 17:09    Procedures Procedures    Medications Ordered in ED Medications  sodium chloride 0.9 % bolus 1,000 mL (0 mLs Intravenous Stopped 11/26/22 1647)  meclizine (ANTIVERT) tablet 50 mg (50 mg Oral Given 11/26/22 1532)  iohexol (OMNIPAQUE) 350 MG/ML injection 75 mL (75 mLs Intravenous Contrast Given 11/26/22 1644)    ED Course/ Medical Decision Making/ A&P                             Medical Decision Making Amount and/or Complexity of Data Reviewed Labs: ordered. Radiology: ordered.  Risk Prescription drug management.    Lucky Garces is a 52 y.o. male with a past medical history significant for hypertension who presents from urgent care for further evaluation of dizziness.  According to patient, he has had 2 days of persistent room spinning dizziness.  He reports he had a very mild episode many years ago and was never had a diagnosis of vertigo.  He reports has been feeling well recently.  He reports no fevers, chills, headache, neck pain, neck stiffness, diarrhea, or urinary changes.  He reports no head trauma.  Reports some blurry vision when he is dizzy but otherwise it is not  persistent.  He denies any speech difficulties or extremity symptoms.  He reports that it started Wednesday at 3 AM when he got up to go to the bathroom.  He reports since then has been persistent.  He went to urgent care where he was given Zofran and sent for evaluation.  He has no history of stroke.  He reports some nausea and vomiting and was given Zofran here with improvement in his nausea.  He denies any headaches whatsoever.  On exam, lungs clear and chest nontender.  No murmur.  Abdomen nontender.  No focal neurologic deficits initially with intact sensation strength and pulse in extremities.  Normal finger-nose-finger testing.  Symmetric smile.  Clear speech.  Pupils are symmetric and reactive with normal extract movements.  No carotid bruit.  No neck stiffness.  No back tenderness.  Patient did say that his dizziness was worsened when he sat up and moved his head.  Clinically I suspect he has vertigo however given this 2 days of persistent symptoms and him being sent here for evaluation to rule out stroke or vascular disease, will get CTA head and neck as we do not have MRI here now.  Will give fluids and meclizine and reassess.  Anticipate shared decision-making conversation to determine if we need to discuss with neurology or even consider MRI.  Anticipate reassessment after workup to determine disposition.   5:31 PM CTA reassuring.  Labs overall reassuring with mild hyponatremia. He was given fluids..  Patient reassessed and reports his dizziness has improved with fluids and meclizine.  We offered ED to ED transfer for MRI however given his improving symptoms, he would rather get prescription for meclizine and go home and if symptoms were to change or worsen, we discussed going to [facility] to get MRI.  Patient agrees with plan of care.  Suspect BPPV and patient agrees.  Will discharge with outpatient follow-up recommendations.        Final Clinical Impression(s) / ED Diagnoses Final  diagnoses:  Dizziness  Vertigo    Rx / DC Orders ED Discharge Orders          Ordered    meclizine (ANTIVERT) 25 MG tablet  3 times daily PRN        11/26/22 1732           Clinical Impression: 1. Dizziness   2. Vertigo     Disposition: Discharge  Condition: Good  I have discussed the results, Dx and Tx plan with the pt(& family if present). He/she/they expressed understanding and agree(s) with the plan. Discharge instructions discussed at great length. Strict return precautions discussed and pt &/or family have verbalized understanding of the instructions. No further questions at time of discharge.    New Prescriptions   MECLIZINE (ANTIVERT) 25 MG TABLET    Take 1 tablet (25 mg total) by mouth 3 (three) times daily as needed for dizziness.    Follow Up: [place] [facility] [phone] Schedule an appointment as soon as possible for a visit    [facility] Emergency Department at [facility] [id number] [place] [phone]        Tito Ausmus, Gwenyth Allegra, MD 11/26/22 1734

## 2022-11-26 NOTE — Discharge Instructions (Signed)
Your history, exam, and evaluation today are suggestive of benign positional vertigo causing your dizziness and symptoms.  The CTA of your head and neck was overall reassuring as we discussed.  Your labs showed some mild dehydration with low sodium that likely improved after the fluids.  As your symptoms have improved, we felt you are safe for discharge home with close PCP follow-up and understanding of return precautions if symptoms were to change, worsen, or not improved.  As we discussed, if things were to worsen we would recommend going to [facility] where they have MRI which is likely the neck step to rule out a stroke cause of symptoms.  Your exam was reassuring and we feel you are safe.  Please rest and stay hydrated.
